# Patient Record
Sex: Male | Born: 1950 | Race: White | Hispanic: No | Marital: Married | Smoking: Current some day smoker
Health system: Southern US, Community
[De-identification: ages and names within clinical notes are randomized; demographics above are authoritative.]

## PROBLEM LIST (undated history)

## (undated) DIAGNOSIS — E785 Hyperlipidemia, unspecified: Secondary | ICD-10-CM

## (undated) DIAGNOSIS — N200 Calculus of kidney: Secondary | ICD-10-CM

## (undated) DIAGNOSIS — I2510 Atherosclerotic heart disease of native coronary artery without angina pectoris: Secondary | ICD-10-CM

## (undated) DIAGNOSIS — I739 Peripheral vascular disease, unspecified: Secondary | ICD-10-CM

## (undated) DIAGNOSIS — Z9289 Personal history of other medical treatment: Secondary | ICD-10-CM

## (undated) DIAGNOSIS — K219 Gastro-esophageal reflux disease without esophagitis: Secondary | ICD-10-CM

## (undated) HISTORY — DX: Personal history of other medical treatment: Z92.89

## (undated) HISTORY — DX: Hyperlipidemia, unspecified: E78.5

## (undated) HISTORY — PX: CORONARY STENT PLACEMENT: SHX1402

## (undated) HISTORY — DX: Calculus of kidney: N20.0

## (undated) HISTORY — PX: OTHER SURGICAL HISTORY: SHX169

## (undated) HISTORY — DX: Atherosclerotic heart disease of native coronary artery without angina pectoris: I25.10

## (undated) HISTORY — DX: Peripheral vascular disease, unspecified: I73.9

## (undated) HISTORY — DX: Gastro-esophageal reflux disease without esophagitis: K21.9

---

## 1999-06-19 ENCOUNTER — Inpatient Hospital Stay (HOSPITAL_COMMUNITY): Admission: RE | Admit: 1999-06-19 | Discharge: 1999-06-20 | Payer: Self-pay | Admitting: Orthopaedic Surgery

## 1999-06-19 ENCOUNTER — Encounter (INDEPENDENT_AMBULATORY_CARE_PROVIDER_SITE_OTHER): Payer: Self-pay | Admitting: *Deleted

## 2003-05-10 ENCOUNTER — Encounter: Admission: RE | Admit: 2003-05-10 | Discharge: 2003-05-10 | Payer: Self-pay | Admitting: Orthopaedic Surgery

## 2003-06-03 ENCOUNTER — Encounter: Admission: RE | Admit: 2003-06-03 | Discharge: 2003-06-03 | Payer: Self-pay | Admitting: Orthopaedic Surgery

## 2005-09-01 ENCOUNTER — Emergency Department (HOSPITAL_COMMUNITY): Admission: EM | Admit: 2005-09-01 | Discharge: 2005-09-01 | Payer: Self-pay | Admitting: Family Medicine

## 2005-09-04 ENCOUNTER — Emergency Department (HOSPITAL_COMMUNITY): Admission: EM | Admit: 2005-09-04 | Discharge: 2005-09-04 | Payer: Self-pay | Admitting: Family Medicine

## 2005-09-11 ENCOUNTER — Emergency Department (HOSPITAL_COMMUNITY): Admission: EM | Admit: 2005-09-11 | Discharge: 2005-09-11 | Payer: Self-pay | Admitting: Family Medicine

## 2007-10-24 ENCOUNTER — Ambulatory Visit: Payer: Self-pay | Admitting: Cardiology

## 2007-10-25 ENCOUNTER — Inpatient Hospital Stay (HOSPITAL_COMMUNITY): Admission: EM | Admit: 2007-10-25 | Discharge: 2007-10-27 | Payer: Self-pay | Admitting: Emergency Medicine

## 2007-11-16 ENCOUNTER — Ambulatory Visit: Payer: Self-pay | Admitting: Cardiology

## 2007-12-20 ENCOUNTER — Ambulatory Visit: Payer: Self-pay

## 2007-12-20 ENCOUNTER — Ambulatory Visit: Payer: Self-pay | Admitting: Cardiology

## 2007-12-20 LAB — CONVERTED CEMR LAB
ALT: 27 units/L (ref 0–53)
AST: 27 units/L (ref 0–37)
Alkaline Phosphatase: 76 units/L (ref 39–117)
Total Bilirubin: 1 mg/dL (ref 0.3–1.2)
Total CHOL/HDL Ratio: 3.8
VLDL: 18 mg/dL (ref 0–40)

## 2008-03-30 DIAGNOSIS — E785 Hyperlipidemia, unspecified: Secondary | ICD-10-CM

## 2008-04-01 DIAGNOSIS — I2510 Atherosclerotic heart disease of native coronary artery without angina pectoris: Secondary | ICD-10-CM

## 2008-04-02 ENCOUNTER — Encounter: Payer: Self-pay | Admitting: Cardiology

## 2008-04-02 ENCOUNTER — Ambulatory Visit: Payer: Self-pay | Admitting: Cardiology

## 2008-04-21 ENCOUNTER — Emergency Department (HOSPITAL_COMMUNITY): Admission: EM | Admit: 2008-04-21 | Discharge: 2008-04-21 | Payer: Self-pay | Admitting: *Deleted

## 2008-06-05 ENCOUNTER — Telehealth: Payer: Self-pay | Admitting: Cardiology

## 2008-06-27 ENCOUNTER — Telehealth: Payer: Self-pay | Admitting: Cardiology

## 2008-07-01 ENCOUNTER — Encounter: Payer: Self-pay | Admitting: Cardiology

## 2008-08-06 ENCOUNTER — Emergency Department (HOSPITAL_COMMUNITY): Admission: EM | Admit: 2008-08-06 | Discharge: 2008-08-06 | Payer: Self-pay | Admitting: Family Medicine

## 2008-10-01 ENCOUNTER — Ambulatory Visit: Payer: Self-pay | Admitting: Cardiology

## 2008-11-14 ENCOUNTER — Telehealth: Payer: Self-pay | Admitting: Cardiology

## 2009-04-26 ENCOUNTER — Emergency Department (HOSPITAL_COMMUNITY): Admission: EM | Admit: 2009-04-26 | Discharge: 2009-04-26 | Payer: Self-pay | Admitting: Family Medicine

## 2009-09-15 ENCOUNTER — Ambulatory Visit: Payer: Self-pay | Admitting: Cardiology

## 2009-09-15 DIAGNOSIS — I739 Peripheral vascular disease, unspecified: Secondary | ICD-10-CM

## 2009-10-08 ENCOUNTER — Telehealth (INDEPENDENT_AMBULATORY_CARE_PROVIDER_SITE_OTHER): Payer: Self-pay | Admitting: *Deleted

## 2009-10-08 ENCOUNTER — Ambulatory Visit: Payer: Self-pay | Admitting: Cardiovascular Disease

## 2009-10-08 ENCOUNTER — Ambulatory Visit: Payer: Self-pay

## 2009-10-14 ENCOUNTER — Telehealth (INDEPENDENT_AMBULATORY_CARE_PROVIDER_SITE_OTHER): Payer: Self-pay | Admitting: *Deleted

## 2010-02-01 LAB — CONVERTED CEMR LAB
ALT: 26 units/L (ref 0–53)
AST: 23 units/L (ref 0–37)
AST: 24 units/L (ref 0–37)
Albumin: 4.7 g/dL (ref 3.5–5.2)
Alkaline Phosphatase: 70 units/L (ref 39–117)
Alkaline Phosphatase: 78 units/L (ref 39–117)
BUN: 16 mg/dL (ref 6–23)
Basophils Absolute: 0 10*3/uL (ref 0.0–0.1)
Basophils Relative: 0.5 % (ref 0.0–3.0)
Bilirubin, Direct: 0 mg/dL (ref 0.0–0.3)
CO2: 32 meq/L (ref 19–32)
CO2: 32 meq/L (ref 19–32)
Calcium: 10 mg/dL (ref 8.4–10.5)
Chloride: 107 meq/L (ref 96–112)
Cholesterol: 146 mg/dL (ref 0–200)
Cholesterol: 168 mg/dL (ref 0–200)
Creatinine, Ser: 0.9 mg/dL (ref 0.4–1.5)
Eosinophils Absolute: 0.1 10*3/uL (ref 0.0–0.7)
Eosinophils Relative: 1.4 % (ref 0.0–5.0)
GFR calc non Af Amer: 82.7 mL/min (ref >60)
GFR calc non Af Amer: 92.65 mL/min (ref >60)
Glucose, Bld: 110 mg/dL — ABNORMAL HIGH (ref 70–99)
HCT: 47.9 % (ref 39.0–52.0)
HCT: 48.4 % (ref 39.0–52.0)
HDL: 34.5 mg/dL — ABNORMAL LOW (ref >39.00)
HDL: 37.4 mg/dL — ABNORMAL LOW (ref >39.00)
Hemoglobin: 16.4 g/dL (ref 13.0–17.0)
LDL Cholesterol: 75 mg/dL (ref 0–99)
Lymphocytes Relative: 27.2 % (ref 12.0–46.0)
Lymphocytes Relative: 27.3 % (ref 12.0–46.0)
Lymphs Abs: 1.9 10*3/uL (ref 0.7–4.0)
MCHC: 33.8 g/dL (ref 30.0–36.0)
MCHC: 34.1 g/dL (ref 30.0–36.0)
MCV: 92.5 fL (ref 78.0–100.0)
MCV: 92.8 fL (ref 78.0–100.0)
Monocytes Absolute: 0.6 10*3/uL (ref 0.1–1.0)
Monocytes Relative: 5.8 % (ref 3.0–12.0)
Monocytes Relative: 9.1 % (ref 3.0–12.0)
Neutro Abs: 4.4 10*3/uL (ref 1.4–7.7)
Neutrophils Relative %: 61.7 % (ref 43.0–77.0)
Platelets: 175 10*3/uL (ref 150.0–400.0)
Potassium: 4.5 meq/L (ref 3.5–5.1)
Potassium: 5 meq/L (ref 3.5–5.1)
RBC: 5.24 M/uL (ref 4.22–5.81)
RDW: 12.7 % (ref 11.5–14.6)
RDW: 13.4 % (ref 11.5–14.6)
Sodium: 143 meq/L (ref 135–145)
Sodium: 144 meq/L (ref 135–145)
Total Bilirubin: 1 mg/dL (ref 0.3–1.2)
Total CHOL/HDL Ratio: 4
Total Protein: 7.6 g/dL (ref 6.0–8.3)
Total Protein: 7.7 g/dL (ref 6.0–8.3)
Triglycerides: 181 mg/dL — ABNORMAL HIGH (ref 0.0–149.0)
Triglycerides: 185 mg/dL — ABNORMAL HIGH (ref 0.0–149.0)
VLDL: 36.2 mg/dL (ref 0.0–40.0)
WBC: 7 10*3/uL (ref 4.5–10.5)

## 2010-02-03 NOTE — Assessment & Plan Note (Signed)
Summary: f1y/appt time 1:30/lg   Visit Type:  Follow-up Primary Provider:  none  CC:  no complaints.  History of Present Illness: The patient is 60 years old and return for management of CAD. 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 His ejection fraction was 6% that time. 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  He has done well since his heart attack. He had no recent chest pain shortness of breath or palpitations.  His other major problem is hyperlipidemia.  He is still unemployed and has severe back problems that limit some of his options for working. His wife works and 70 now has some financial help and he does have insurance and his wife.  Current Medications (verified): 1)  Aspirin 81 Mg Tbec (Aspirin) .... Take One Tablet By Mouth Daily 2)  Crestor 40 Mg Tabs (Rosuvastatin Calcium) .... Take One Tablet By Mouth Daily. 3)  Nitroglycerin 0.4 Mg Subl (Nitroglycerin) .... One Tablet Under Tongue Every 5 Minutes As Needed For Chest Pain---May Repeat Times Three  Allergies (verified): No Known Drug Allergies  Past History:  Past Medical History: Reviewed history from 03/30/2008 and no changes required.  1. Coronary artery disease status post inferior wall myocardial     infarction on October 24, 2007, treated with a bare-metal stent to     the right coronary artery with residual 70% narrowing in the left     anterior descending. 2. Good left ventricular function with ejection fraction of 60%. 3. Hyperlipidemia.   Review of Systems       ROS is negative except as outlined in HPI.   Vital Signs:  Patient profile:   60 year old male Height:      69 inches Weight:      200 pounds BMI:     29.64 Pulse rate:   69 / minute BP sitting:   153 / 92  (left arm) Cuff size:   regular  Vitals Entered By: Burnett Kanaris, CNA (September 15, 2009 1:32 PM)  Physical Exam  Additional Exam:  Gen.  Well-nourished, in no distress   Neck: No JVD, thyroid not enlarged, no carotid bruits Lungs: No tachypnea, clear without rales, rhonchi or wheezes Cardiovascular: Rhythm regular, PMI not displaced,  heart sounds  normal, no murmurs or gallops, no peripheral edema, pulses normal in all 4 extremities. Abdomen: BS normal, abdomen soft and non-tender without masses or organomegaly, no hepatosplenomegaly. MS: No deformities, no cyanosis or clubbing   Neuro:  No focal sns   Skin:  no lesions    Impression & Recommendations:  Problem # 1:  CAD, NATIVE VESSEL (ICD-414.01)  He had a previous anterior MI with a bare-metal stent to the RCA. He's had no recent chest pain in the thumb appears stable. His updated medication list for this problem includes:    Aspirin 81 Mg Tbec (Aspirin) .Marland Kitchen... Take one tablet by mouth daily    Nitroglycerin 0.4 Mg Subl (Nitroglycerin) ..... One tablet under tongue every 5 minutes as needed for chest pain---may repeat times three  Orders: EKG w/ Interpretation (93000)  His updated medication list for this problem includes:    Aspirin 81 Mg Tbec (Aspirin) .Marland Kitchen... Take one tablet by mouth daily    Nitroglycerin 0.4 Mg Subl (Nitroglycerin) ..... One tablet under tongue every 5 minutes as needed for chest pain---may repeat times three  Problem # 2:  HYPERLIPIDEMIA-MIXED (ICD-272.4)  We will plan to get a fasting lipid profile. His updated medication list for this problem includes:    Crestor 40 Mg Tabs (Rosuvastatin calcium) .Marland Kitchen... Take one tablet by mouth daily.  His updated medication list for this problem includes:    Crestor 40 Mg Tabs (Rosuvastatin calcium) .Marland Kitchen... Take one tablet by mouth daily.  Problem # 3:  CLAUDICATION (ICD-443.9) He has right calf pain with walking which is suspicious for claudication. His pulses are pretty good in both feet. He also had lumbosacral spine disease and he had some symptoms higher in his leg and is possible his symptoms could be  related to that. We will evaluate him with arterial Doppler studies.  Other Orders: Arterial Duplex Lower Extremity (Arterial Duplex Low)  Patient Instructions: 1)  Your physician recommends that you schedule a follow-up appointment in: 1 YEAR WITH DR [facility] 2)  Your physician recommends that you return for lab work EA:[id number] BMET CBC LIPID LIVER TSH  SAME DAY AS ARTERIAL ULTRASOUND 3)  Your physician recommends that you continue on your current medications as directed. Please refer to the Current Medication list given to you today. 4)  Your physician has requested that you have a lower or upper extremity arterial duplex.  This test is an ultrasound of the arteries in the legs or arms.  It looks at arterial blood flow in the legs and arms.  Allow one hour for Lower and Upper Arterial scans. There are no restrictions or special instructions.LABS SAME DAY

## 2010-02-03 NOTE — Progress Notes (Signed)
  Phone Note Other Incoming   Request: Send information Summary of Call: Request for records received from [facility]. Request forwarded to [facility] )

## 2010-02-03 NOTE — Progress Notes (Signed)
  Request for records recieved from [facility] sent to [facility]  October 14, 2009 8:51 AM

## 2010-03-24 LAB — POCT URINALYSIS DIP (DEVICE)
Ketones, ur: NEGATIVE mg/dL
Nitrite: NEGATIVE
Specific Gravity, Urine: >=1.03 (ref 1.005–1.030)
Urobilinogen, UA: 0.2 mg/dL (ref 0.0–1.0)
pH: 5 (ref 5.0–8.0)

## 2010-04-11 LAB — POCT URINALYSIS DIP (DEVICE)
Glucose, UA: NEGATIVE mg/dL
Specific Gravity, Urine: >=1.03 (ref 1.005–1.030)
pH: 5 (ref 5.0–8.0)

## 2010-04-15 LAB — URINALYSIS, ROUTINE W REFLEX MICROSCOPIC
Bilirubin Urine: NEGATIVE
Glucose, UA: NEGATIVE mg/dL
Hgb urine dipstick: NEGATIVE
Protein, ur: 30 mg/dL — AB
Urobilinogen, UA: 0.2 mg/dL (ref 0.0–1.0)
pH: 5.5 (ref 5.0–8.0)

## 2010-04-15 LAB — URINE MICROSCOPIC-ADD ON

## 2010-04-15 LAB — POCT I-STAT, CHEM 8
Calcium, Ion: 1.14 mmol/L (ref 1.12–1.32)
Hemoglobin: 16.7 g/dL (ref 13.0–17.0)
TCO2: 23 mmol/L (ref 0–100)

## 2010-05-19 NOTE — Procedures (Signed)
[facility]                              EXERCISE TREADMILL   NAME:PENNINGERThierry, Dobosz                      MRN:          [id number]  DATE:12/20/2007                            DOB:          12/24/1950    PRIMARY CARE PHYSICIAN:  None.   INDICATIONS:  Evaluation for ischemia.   The patient is able to exercise 8 minutes on a Bruce protocol and  achieved the heart rate of 125, at which time the test was terminated  due to light fatigue.  There was no chest pain.  The resting  electrocardiogram was normal.  There were no arrhythmias during  exercise.  Also __________EKG showed no significant ST segment changes.  Blood pressure went from 118/78 to 167/83.  This interpreted the  nondiagnostic test and inadequate heart rate, but there was evidence of  ischemia.   Gwynn Gallardo returned for followup treadmill test today.  He had a  diaphragmatic wall infarction in October 24, 2007, treated with bare-  metal stent with right coronary, had residual 70% narrowing in the LAD.  Ejection fraction was 60%.  We did this treadmill test on today to  assess him for ischemia.  We did a stress ECG rather than a Myoview scan  for financial reasons.   His stress test was quite good, although he did not achieve the target  heart rate.  There was no evidence of ischemia.  Based on these  findings, I think we can continue medical therapy.  He is getting a  lipid profile today.  We will plan to see him to back in followup in 3  months.  He is getting help with both his Plavix and his Crestor from  the company assistant programs.     Bruce Elvera Lennox Juanda Chance, MD, [facility]  Electronically Signed    BRB/MedQ  DD: 12/20/2007  DT: 12/21/2007  Job #: [id number]

## 2010-05-19 NOTE — H&P (Signed)
NAME:  Keith, [name] NO.:  [id number]   MEDICAL RECORD NO.:  [id number]          PATIENT TYPE:  EMS   LOCATION:  MAJO                         FACILITY:  [facility]   PHYSICIAN:  Darryl D. Prime, MD    DATE OF BIRTH:  01/10/1950   DATE OF ADMISSION:  10/24/2007  DATE OF DISCHARGE:                              HISTORY & PHYSICAL   CHIEF COMPLAINT:  Chest pain.   PRIMARY CARE PHYSICIAN:  Keith [name] has no primary care physician.   CODE STATUS:  Keith [name] is full code.   TOTAL VISIT TIME:  Approximately 30 minutes.  Code STEMI was called in  the emergency room.   HISTORY OF PRESENT ILLNESS:  Keith [name] is a 60 year old male with a  history of longstanding tobacco abuse and strong family history of  coronary artery disease and events who presents with chest pain.  The  patient notes mild chest pressure on yesterday around 5:00 p.m. lasting  less than 30 minutes that went away spontaneously.  The patient then  notes chest pain around 9:30 p.m. tonight around 7/10 with associated  sweats but no associated nausea, vomiting.  The patient was brought to  the emergency room by his wife.  The patient's first EKG at 2223 showed  sinus bradycardia at 50 beats per minute.  Keith [name] had less than 1 mm ST  elevation inferiorly.  The patient, at 2235, was in sinus rhythm at 69  beats per minute, PR interval 163, QRS 83, QT corrected at 410, and Keith [name]  had normal axis, had 3 mm ST elevation inferiorly, and had ST  depressions leads V1-V3 and ST elevation V5-V6 concerning for inferior  posterior injury.  The patient was given heparin bolus 1000 units  sublingual nitroglycerin, morphine 4 mg total, Lopressor 5 IV, chest  pain still being 6-7/10 range.  Keith [name] was emergently taken to the cath lab.   PAST MEDICAL AND SURGICAL HISTORY:  Keith [name] has not seen a primary care  physician in many years.  Keith [name] has a history of two back surgeries, and Keith [name]  has a history of childhood hepatitis.   ALLERGIES:  NO KNOWN DRUG  ALLERGIES.   MEDICATIONS:  None.   SOCIAL HISTORY:  The patient apparently has smoked since the age of 29,  two packs a day.  No alcohol.  Keith [name] was recently laid off.  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   FAMILY HISTORY:  Father had MI in his 30s and passed away from that.  Keith [name]  has three sisters with coronary artery disease.  This began in the 62s.  One sister is status post CABG.   REVIEW OF SYSTEMS:  Limited.  The patient denies any fever.  Keith [name] notes  mild shortness of breath.  Denies any recent black stools or bloody  stools.   PHYSICAL EXAMINATION:  VITAL SIGNS:  Pulse 72 with a respiratory rate of  14, blood pressure 134/70 with sats at 100% on 2 liters nasal cannula.  GENERAL:  Keith [name] is a male, sitting upright in bed in mild diaphoresis.  HEENT:  Normocephalic, atraumatic.  Pupils equal, round, reactive to  light.  Extraocular movements grossly intact.  Conjunctivae is not pale.  The patient's oropharynx reveals no posterior pharyngeal lesions.  NECK:  Supple with no lymphadenopathy or thyromegaly.  No jugular venous  distention was appreciated.  No carotid bruits.  LUNGS:  Clear to auscultation bilaterally.  CARDIOVASCULAR:  Regular rhythm and rate with no murmurs, rubs or  gallops.  Normal S1, S2.  No S3-S4.  ABDOMEN:  Soft, nontender, nondistended with no hepatosplenomegaly.  EXTREMITIES;  show no clubbing, cyanosis or edema.  NEUROLOGIC:  Alert and oriented x4.  Cranial nerves II-XII grossly  intact.  Strength and sensation are grossly intact.  The EKGs are as  above.  VASCULAR:  Femoral pulses 2+.  Dorsalis pedis, posterior tibial and  radial pulses 2+ symmetric.   LABORATORY DATA:  Pending.  EKG is as above.  Keith [name] had an EKG June 17, 1999, that was normal sinus rhythm at 74 beats per minute.  There were  no significant EKG ST-segment changes at that time.   ASSESSMENT:  A patient with a history of his longstanding tobacco abuse  and family history significant for coronary artery  disease who presents  now with inferior posterior MI.  Keith [name] will be admitted emergently and  taken to the cath lab with further course of his management depending on  the results of catheterization nondiagnostic.      Darryl D. Prime, MD  Electronically Signed     DDP/MEDQ  D:  10/24/2007  T:  10/25/2007  Job:  [id number]

## 2010-05-19 NOTE — Cardiovascular Report (Signed)
NAME:  Keith [name], Keith [name] NO.:  [id number]   MEDICAL RECORD NO.:  [id number]          PATIENT TYPE:  INP   LOCATION:  2906                         FACILITY:  [facility]   PHYSICIAN:  Everardo Beals. Juanda Chance, MD, FACCDATE OF BIRTH:  23-Aug-1950   DATE OF PROCEDURE:  10/24/2007  DATE OF DISCHARGE:                            CARDIAC CATHETERIZATION   Keith [name] is 60 years old and has no prior history of known heart  disease.  He worked for a [employer], but recently got laid off.  He developed chest pain tonight and came to the emergency room where his  ECG showed acute inferior myocardial infarction.  He was seen in consult  by Dr. Oralia Rud, our Cardiology fellow and he was transferred promptly to  the catheterization laboratory for intervention.  He is a smoker.   He has been on no medications.  He has no primary care physician.   PROCEDURE:  The procedure was performed via the right femoral arteries  and arterial sheath and a 6-French preformed coronary catheters.  We did  a diagnostic cath from the left coronary artery and went in with a guide  to the right coronary artery.  We had to change to an AL1 catheter  because the right coronary artery had a high anterior takeoff.  The  patient developed hypotension before we crossed with the wire requiring  dopamine.  We also placed a venous sheath in the right femoral vein.  We  then navigated a Prowater wire across the lesion and dilated with a 2.25  x 20-mm apex balloon performing 2 inflations up to 10 atmospheres for 30  seconds.  We then deployed a 2.75 x 28-mm Liberte deploying this with 1  inflation up to 15 atmospheres for 30 seconds.  We postdilated with a  3.25 x 20-mm [place] Voyager performing 3 inflations up to 16 atmospheres for  30 seconds.  Final diagnostic studies were then performed through the  guiding catheter.  The patient required dopamine intermittently  throughout the procedure.  He had some runs of accelerated  ventricular  rhythm, otherwise he tolerated the procedure well and left the  laboratory in satisfactory condition.   RESULTS:  The aortic pressure was 98/59 with mean of 73 and left  ventricular pressure was 98/20.   The left main coronary artery.  Main coronary artery was free of  significant disease.   Left anterior descending artery.  Anterior descending artery gave rise  to 3 septal perforators in diagonal branch.  There was a long 70%  segmental stenosis extending from the third septal perforator and  crossing the first diagonal branch, located in the proximal LAD.   The circumflex artery.  Circumflex artery was a moderate-sized vessel  that gave rise to 2 atrial branches in the marginal branch and 2 large  posterolateral branches.  These vessels were irregular with no  significant obstruction.   The right coronary artery.  The right coronary artery was a moderate-  sized vessel that gave rise to a right ventricular branch, then had a  99% stenosis with TIMI 1 flow.  There was  very faint collaterals from  the left coronary system to the distal right coronary artery.   The left ventriculogram.  Underlying left ventriculogram performed in  RAO projection shows a slight hypokinesis of the inferobasal wall.  The  overall motion was quite good with an estimated ejection fraction of  60%.   Following stenting of the lesion in the mid-right coronary artery, the  stenosis improved from 99% to 0% and the flow improved from TIMI 1 to  TIMI 3 flow.  There was also a 50% proximal stenosis which was not  treated.  Distal vessel filled dual posterior descending branches and a  moderate-sized posterolateral branch which were free of significant  disease.   The patient had the onset of chest pain at 2130 and arrived in the  emergency room at 2229.  He arrived in the cath lab at 2315 and the  first balloon inflation was at 2346.  He had a total balloon time of 77  minutes and reinfusion  time of 2 hours 16 minutes.   CONCLUSION:  1. Acute diaphragmatic wall infarction with 99% stenosis in the mid      right coronary artery with TIMI 1 flow, no major obstruction of      circumflex artery, 70% narrowing in the proximal LAD, and mild      inferobasal wall hypokinesis with an estimated ejection fraction of      60%.  2. Successful reperfusion and stenting of the lesion in the mid-right      coronary artery using a Liberte bare metal stent with improvement      in central narrowing from 99% to 0% and improvement in the flow      from TIMI 1 to TIMI 3 flow.   DISPOSITION:  The patient was returned to __________ for further  observation.  We will plan to evaluate the LAD lesion later with a  Myoview scan after he is recovered from his infarction.  He will need  smoking cessation consultation.      Bruce Elvera Lennox Juanda Chance, MD, [facility]  Electronically Signed     BRB/MEDQ  D:  10/25/2007  T:  10/25/2007  Job:  [id number]

## 2010-05-19 NOTE — Assessment & Plan Note (Signed)
[facility]                            CARDIOLOGY OFFICE NOTE   NAME:PENNINGERKeola, Heninger                      MRN:          [id number]  DATE:11/16/2007                            DOB:          Dec 27, 1950    ADDENDUM   Mr. Scogin also described calf pain when he walks, it sounds like  claudication.  We will evaluate that when he returns for his treadmill  test.     Bruce R. Juanda Chance, MD, [facility]     BRB/MedQ  DD: 11/16/2007  DT: 11/17/2007  Job #: [id number]

## 2010-05-19 NOTE — Assessment & Plan Note (Signed)
[facility]                            CARDIOLOGY OFFICE NOTE   [name], Keith [name]                      MRN:          [id number]  DATE:11/16/2007                            DOB:          August 06, 1950    PRIMARY CARE PHYSICIAN:  None.   CLINICAL HISTORY:  [name] is 60 years old and was admitted on  October 24, 2007, with a diaphragmatic wall infarction and underwent  stenting of the right coronary artery with a bare-metal stent.  He had  residual 70% narrowing in the LAD and he had an ejection fraction of  60%.   He has done quite well since that time, has had no recurrent chest pain,  shortness of breath, or palpitations.   Past medical history is significant for hyperlipidemia and previous back  surgeries.   His current medications include Crestor 40 mg daily, Plavix 75 mg daily,  and aspirin 325 mg daily.   On examination today, the blood pressure is 104/80.  There was no venous  tension.  The carotid pulses were full without bruits.  Chest was clear.  Cardiac rhythm was regular.  I could hear no murmurs or gallops.  The  abdomen was soft with normal bowel sounds.  There is no  hepatosplenomegaly.  Peripheral pulses were full with no peripheral  edema.   Electrocardiogram showed inferior T-wave changes.   IMPRESSION:  1. Coronary artery disease status post inferior wall myocardial      infarction on October 24, 2007, treated with a bare-metal stent to      the right coronary artery with residual 70% narrowing in the left      anterior descending.  2. Good left ventricular function with ejection fraction of 60%.  3. Hyperlipidemia.   RECOMMENDATIONS:  I think [name] is doing very well.  I told him  he could gradually increase his activities over the next 2 weeks toward  normal.  He cannot do the rehab program for financial reasons and I  talked to him about walking on a regular schedule.  He is already  walking 20 minutes  twice a day.  We will have him come in for a fasting  lipid profile in 4 weeks after he has been on the Crestor for 4 weeks.  We will do a standard stress test rather than a Myoview scan for  financial reasons to screen him for ischemia in left anterior descending  distribution.  We will do this in 4 weeks.  We are still awaiting based  on Myoview regarding help with Plavix.   ADDENDUM:  [name] also described calf pain when he walks, it  sounds like claudication.  We will evaluate that when he returns for his  treadmill test.     Bruce R. Juanda Chance, MD, [facility]  Electronically Signed    BRB/MedQ  DD: 11/16/2007  DT: 11/17/2007  Job #: [id number]

## 2010-05-19 NOTE — Discharge Summary (Signed)
NAME:  Keith [name], SCRIBNER NO.:  [id number]   MEDICAL RECORD NO.:  [id number]          PATIENT TYPE:  INP   LOCATION:  2926                         FACILITY:  [facility]   PHYSICIAN:  Everardo Beals. Juanda Chance, MD, FACCDATE OF BIRTH:  1950-02-10   DATE OF ADMISSION:  10/24/2007  DATE OF DISCHARGE:  10/27/2007                               DISCHARGE SUMMARY   PROCEDURES:  1. Cardiac catheterization.  2. Coronary arteriogram.  3. Left ventriculogram.  4. Percutaneous transluminal coronary angioplasty bare-metal stent to      one vessel.   PRIMARY FINAL DISCHARGE DIAGNOSIS:  Acute inferior ST elevation  myocardial infarction.   SECONDARY DIAGNOSES:  1. Preserved left ventricular function with an ejection fraction of      60% at catheterization, inferior wall motion abnormality noted.  2. Residual coronary artery disease of the left anterior descending at      70% and the proximal right coronary artery 50% for which medical      therapy is recommended.  3. Tobacco use.  4. Sinus bradycardia occasionally dropping into the 40s, asymptomatic.  5. Possible hyperlipidemia, lipid profile pending at the time of      dictation.  6. History of back surgery.  7. Family history of coronary artery disease in his mother and father.   TIME OF DISCHARGE:  43 minutes.   HOSPITAL COURSE:  Keith [name] is a 60 year old male with no previous  history of coronary artery disease.  He had chest pain that started the  night of admission and was brought to the emergency room by his wife.  His EKG indicated an inferior ST elevation MI and he was taken urgently  to the cath lab.  Total balloon time was 77 minutes.   Keith [name] had a 99% RCA in the mid section that was treated with  PTCA and a bare-metal stent reducing the stenosis to 0.  His flow went  from TIMI 1 to TIMI 3.  Residual coronary artery disease is to be  treated medically.  His EF was approximately 60% with inferior   hypokinesis.   Keith [name] was seen by cardiac rehab and smoking cessation.  He  states he has been told that he has elevated cholesterol and lipid  profile is pending at the time of dictation.  A statin was added  empirically to his medication regimen and he will follow up in our  office.  His heart rate was noted to be in the 50s and occasional into  the 40s.  For this reason, no beta-blocker was started.  He is  asymptomatic with this and no further workup is indicated at this time.   On October 27, 2007, Keith [name] was ambulating without chest pain or  shortness of breath.  Care management had seen him for medication  assistance as both he and his wife are currently out of work.  Plavix  assistance forms and Crestor assistance forms were filled out and we  will follow up in the office.  Dr. Juanda Chance evaluated Keith [name] on  October 27, 2007 and considered him stable for discharge with  close  outpatient followup.   DISCHARGE INSTRUCTIONS:  1. His activity level is to be increased gradually with no lifting for      3 weeks and no driving for 4 days.  2. He is to call our office for any problems with the cath site.  3. He is to stick to a low-sodium heart-healthy diet.  4. He is encouraged to obtain a primary care physician.  5. He has an appoint with Dr. Juanda Chance on November 16, 2007 at 3:45.   DISCHARGE MEDICATIONS:  1. Aspirin 325 mg daily.  2. Plavix 75 mg daily.  3. Nitroglycerin sublingual p.r.n.  4. Simvastatin 80 mg or Crestor 40 mg daily p.m.   He is strongly encouraged not to use tobacco.      Theodore Demark, PA-C      Bruce R. Juanda Chance, MD, [facility]  Electronically Signed    RB/MEDQ  D:  10/27/2007  T:  10/27/2007  Job:  [id number]
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
## 2010-05-22 NOTE — Op Note (Signed)
[place]  Patient:    Keith [name], Keith [name]                      MRN: [id number] Proc. Date: 06/19/99 Adm. Date:  [id number] Disc. Date: [id number] Attending:  Jacki Cones                           Operative Report  PREOPERATIVE DIAGNOSIS:  Recurrent L3-4 herniated nucleus pulposus right, L5-S1 herniated nucleus pulposus left with free fragment.  POSTOPERATIVE DIAGNOSIS:  Recurrent L3-4 herniated nucleus pulposus right, L5-S1 herniated nucleus pulposus left with free fragment.  OPERATION PERFORMED:  Re-exploration and microdiskectomy right L3-4 with removal of herniated nucleus pulposus, left L5-S1 microdiskectomy and removal of free fragment.  SURGEON:  Mark C. Ophelia Charter, M.D.  ASSISTANT: 1. ____________ 2. Quinn Plowman, [place]  ANESTHESIA:  General orotracheal.  ESTIMATED BLOOD LOSS:  225 cc.  DESCRIPTION OF PROCEDURE:  After induction of general anesthesia, patient placed in kneeling position on the Blyn frame with careful padding.  The patients back was prepped with DuraPrep.  Preoperative Ancef was given prophylactically.  Old incision from thet 3-4 exploration was visualized and cross table lateral x-ray was taken with one needle slightly inferior to 3-4 at the level of the disk herniation on MRI and the other needle at L5-S1. Cross table lateral confirmed that these needles were appropriately placed. Incision was made in the midline using the old incision, extending it proximally and distally.  L5-S1 herniated nucleus pulposus on the left was explored first.  Subperiosteal dissection was performed over the lamina. Taylor retractor placed lateral.  Key hole laminotomy made with a Kerrison. Incision of the ligament.  Dura was gently retracted to the midline and free fragment disk was present that was in the left lateral gutter above the level of the disk space.  Once the fragment was teased out, exploration revealed some further small  pieces which were removed.  Hockey stick was used for palpation and there were no remaining fragments.  The nerve root was freed. Several passes were made through the disk space from the exit hole from the rupture.  A moderate amount of degenerative material was removed.  The foramen was enlarged, nerve root was free.  After irrigation with saline solution, attention was turned to the second level.  Surgeons and assistants switched sides and the right 3-4 level was explored.  At this level there was previous laminotomy defect which was taken cephalad to an area of normal tissue.  There was dense fibrotic scar tissue with the dura adherent to the lateral gutter and lateral disk material which was fibrosed, encapsulated and stuck to the gutter.  Dissection was performed until the disk was finally identified using Epstein curet.  The annulus was incised with a 15 scalpel blade.  Passes were made to the disk removing a small amount of degenerative material.  There were disk fragments laterally in the gutter and also inferior to the level of the disk consistent with the MRI scan.  Disk and scar tissue were adherent at the nerve root shoulder just inferior to the disk space just prior to the nerve swinging out underneath the pedicle and a small dural tear occurred on the volar aspect.  It was 1 mm in size.  There was some spinal fluid leakage and a small piece of Surgicel 3 x 3 mm was placed over it.  The patient was valsalvad up  to 35 cmH2O x 5 with no leakage.  Disk material was removed anterior to the dura until the L4 vertebra body was exposed and all old disk material had been removed.  The foramen was palpated and was free.  After irrigation with saline solution, repeat Valsalva was checked with no spinal fluid leakage.  There was one epidural vein proximally that had been covered with a patty, had a little bit of bleeding and was left alone and had a small amount of bleeding.  The fascia  was closed with 0 Vicryl and 2-0 Vicryl in the subcutaneous tissues, skin staple closure, Marcaine infiltration of the skin. Postoperative dressing.  The patient tolerated the procedure well and was transferred to the recovery room in stable condition. DD:  06/19/99 TD:  06/23/99 Job: [id number]

## 2010-06-27 ENCOUNTER — Inpatient Hospital Stay (INDEPENDENT_AMBULATORY_CARE_PROVIDER_SITE_OTHER)
Admission: RE | Admit: 2010-06-27 | Discharge: 2010-06-27 | Disposition: A | Discharge status: Home / Self Care | Payer: PRIVATE HEALTH INSURANCE | Source: Ambulatory Visit | Attending: Family Medicine | Admitting: Family Medicine

## 2010-06-27 ENCOUNTER — Ambulatory Visit (INDEPENDENT_AMBULATORY_CARE_PROVIDER_SITE_OTHER): Payer: PRIVATE HEALTH INSURANCE

## 2010-06-27 DIAGNOSIS — N200 Calculus of kidney: Secondary | ICD-10-CM

## 2010-06-27 LAB — POCT URINALYSIS DIP (DEVICE)
Glucose, UA: NEGATIVE mg/dL
Ketones, ur: NEGATIVE mg/dL
Leukocytes, UA: NEGATIVE
Nitrite: NEGATIVE
Specific Gravity, Urine: 1.015 (ref 1.005–1.030)
pH: 5.5 (ref 5.0–8.0)

## 2010-09-14 ENCOUNTER — Ambulatory Visit: Payer: PRIVATE HEALTH INSURANCE | Admitting: Cardiovascular Disease

## 2010-09-25 ENCOUNTER — Encounter: Payer: Self-pay | Admitting: *Deleted

## 2010-09-25 ENCOUNTER — Encounter: Payer: Self-pay | Admitting: Cardiovascular Disease

## 2010-09-28 ENCOUNTER — Encounter: Payer: Self-pay | Admitting: Cardiovascular Disease

## 2010-09-28 ENCOUNTER — Ambulatory Visit (INDEPENDENT_AMBULATORY_CARE_PROVIDER_SITE_OTHER): Payer: PRIVATE HEALTH INSURANCE | Admitting: Cardiovascular Disease

## 2010-09-28 VITALS — BP 140/82 | HR 72 | Ht 68.0 in | Wt 194.0 lb

## 2010-09-28 DIAGNOSIS — E785 Hyperlipidemia, unspecified: Secondary | ICD-10-CM

## 2010-09-28 DIAGNOSIS — I2510 Atherosclerotic heart disease of native coronary artery without angina pectoris: Secondary | ICD-10-CM

## 2010-09-28 NOTE — Patient Instructions (Signed)
Your physician wants you to follow-up in: 12 months.  You will receive a reminder letter in the mail two months in advance. If you don't receive a letter, please call our office to schedule the follow-up appointment.  Your physician recommends that you return for fasting lab work in next week or so--Lipid, Liver, total CK

## 2010-09-28 NOTE — Assessment & Plan Note (Signed)
Continue statin. 

## 2010-09-28 NOTE — Progress Notes (Signed)
History of Present Illness: 60 yo WM with history of CAD, HLD here today for cardiac follow up. He has been followed in the past by Dr. Juanda Chance. He was last seen by Dr. Juanda Chance in September 2011. 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 His ejection fraction was 60% that time. He is a former smoker. He has not smoked since 2009. Lower extremity arterial dopplers October 2011 with ABI of 0.85 on the left and 0.99 on the left.   He tells me that he feels well. No chest pain or SOB. No dizziness, near syncope or syncope. He has been on Crestor. He seems to be having muscle aches in arms,hands and legs. He also describes a cramping in his right leg when walking. This resolves with position changes and rest.   He does not have a primary care physician.    Past Medical History  Diagnosis Date  . HYPERLIPIDEMIA-MIXED   . CAD, NATIVE VESSEL   . Unspecified peripheral vascular disease   . Nephrolithiasis     No past surgical history on file.  Current Outpatient Prescriptions  Medication Sig Dispense Refill  . aspirin 81 MG tablet Take 81 mg by mouth daily.        . rosuvastatin (CRESTOR) 40 MG tablet Take 40 mg by mouth daily.        . tadalafil (CIALIS) 20 MG tablet Take 20 mg by mouth daily as needed.          No Known Allergies  History   Social History  . Marital Status: Married    Spouse Name: N/A    Number of Children: N/A  . Years of Education: N/A   Occupational History  . Not on file.   Social History Main Topics  . Smoking status: Not on file  . Smokeless tobacco: Not on file  . Alcohol Use: Not on file  . Drug Use: Not on file  . Sexually Active: Not on file   Other Topics Concern  . Not on file   Social History Narrative  . No narrative on file    Family History  Problem Relation Age of Onset  . Coronary artery disease      Review of Systems:  As stated in the HPI and otherwise negative.   BP 140/82  Pulse  72  Ht 5\' 8"  (1.727 m)  Wt 194 lb (87.998 kg)  BMI 29.50 kg/m2  Physical Examination: General: Well developed, well nourished, NAD HEENT: OP clear, mucus membranes moist SKIN: warm, dry. No rashes. Neuro: No focal deficits Musculoskeletal: Muscle strength 5/5 all ext Psychiatric: Mood and affect normal Neck: No JVD, no carotid bruits, no thyromegaly, no lymphadenopathy. Lungs:Clear bilaterally, no wheezes, rhonci, crackles Cardiovascular: Regular rate and rhythm. No murmurs, gallops or rubs. Abdomen:Soft. Bowel sounds present. Non-tender.  Extremities: No lower extremity edema. Pulses are 2 + in the bilateral DP/PT.  EKG:NSR, rate 71 bpm.

## 2010-09-28 NOTE — Assessment & Plan Note (Signed)
Stable. He has not been on a beta blocker but does not wish to start. Continue ASA and statin. He has muscle aches. Will check lipids, LFTs and CK. May need to adjust Crestor dose.

## 2010-10-05 ENCOUNTER — Other Ambulatory Visit (INDEPENDENT_AMBULATORY_CARE_PROVIDER_SITE_OTHER): Payer: PRIVATE HEALTH INSURANCE | Admitting: *Deleted

## 2010-10-05 DIAGNOSIS — E785 Hyperlipidemia, unspecified: Secondary | ICD-10-CM

## 2010-10-05 LAB — BASIC METABOLIC PANEL
BUN: 13
CO2: 25
Chloride: 104
Creatinine, Ser: 0.89
GFR calc non Af Amer: >60
Glucose, Bld: 111 — ABNORMAL HIGH
Potassium: 4.2
Sodium: 138

## 2010-10-05 LAB — CBC
HCT: 44.7
Hemoglobin: 15.3
RBC: 5.36
RDW: 13.6
WBC: 9.1

## 2010-10-05 LAB — DIFFERENTIAL
Basophils Absolute: 0.1
Basophils Relative: 1
Eosinophils Relative: 1
Lymphocytes Relative: 30
Monocytes Absolute: 0.9
Monocytes Relative: 7
Neutrophils Relative %: 61

## 2010-10-05 LAB — LIPID PANEL
Cholesterol: 156 mg/dL (ref 0–200)
LDL Cholesterol: 101 mg/dL — ABNORMAL HIGH (ref 0–99)
LDL Cholesterol: 110 — ABNORMAL HIGH
LDL Cholesterol: 143 — ABNORMAL HIGH
Total CHOL/HDL Ratio: 7.5
VLDL: 33
VLDL: 57 — ABNORMAL HIGH

## 2010-10-05 LAB — HEPATIC FUNCTION PANEL
ALT: 19 U/L (ref 0–53)
Albumin: 4.5 g/dL (ref 3.5–5.2)
Alkaline Phosphatase: 71 U/L (ref 39–117)
Total Bilirubin: 0.6 mg/dL (ref 0.3–1.2)

## 2010-10-05 LAB — MAGNESIUM: Magnesium: 2.3

## 2010-10-05 LAB — POCT CARDIAC MARKERS
CKMB, poc: 2.7
Troponin i, poc: 0.06 — ABNORMAL HIGH

## 2010-10-05 LAB — PROTIME-INR
INR: 1
Prothrombin Time: 13.3

## 2010-10-05 LAB — HEMOGLOBIN A1C
Hgb A1c MFr Bld: 6
Mean Plasma Glucose: 126

## 2010-10-05 LAB — TROPONIN I: Troponin I: 11.85

## 2010-10-05 LAB — CK TOTAL AND CKMB (NOT AT ARMC)
Relative Index: 14.8 — ABNORMAL HIGH
Total CK: 932 — ABNORMAL HIGH

## 2010-12-21 ENCOUNTER — Other Ambulatory Visit: Payer: Self-pay | Admitting: *Deleted

## 2010-12-21 MED ORDER — ROSUVASTATIN CALCIUM 40 MG PO TABS: 40.0000 mg | ORAL_TABLET | Freq: Every day | ORAL | Status: DC

## 2011-10-04 ENCOUNTER — Ambulatory Visit: Payer: PRIVATE HEALTH INSURANCE | Admitting: Cardiovascular Disease

## 2011-10-22 ENCOUNTER — Ambulatory Visit (INDEPENDENT_AMBULATORY_CARE_PROVIDER_SITE_OTHER): Payer: PRIVATE HEALTH INSURANCE | Admitting: Cardiovascular Disease

## 2011-10-22 ENCOUNTER — Encounter: Payer: Self-pay | Admitting: Cardiovascular Disease

## 2011-10-22 VITALS — BP 121/78 | HR 58 | Ht 66.0 in | Wt 190.0 lb

## 2011-10-22 DIAGNOSIS — I739 Peripheral vascular disease, unspecified: Secondary | ICD-10-CM

## 2011-10-22 DIAGNOSIS — I2510 Atherosclerotic heart disease of native coronary artery without angina pectoris: Secondary | ICD-10-CM

## 2011-10-22 DIAGNOSIS — E785 Hyperlipidemia, unspecified: Secondary | ICD-10-CM

## 2011-10-22 LAB — HEPATIC FUNCTION PANEL
ALT: 23 U/L (ref 0–53)
AST: 22 U/L (ref 0–37)
Alkaline Phosphatase: 66 U/L (ref 39–117)
Total Bilirubin: 0.4 mg/dL (ref 0.3–1.2)

## 2011-10-22 LAB — LIPID PANEL
HDL: 35.1 mg/dL — ABNORMAL LOW (ref >39.00)
Triglycerides: 72 mg/dL (ref 0.0–149.0)

## 2011-10-22 LAB — CK: Total CK: 215 U/L (ref 7–232)

## 2011-10-22 NOTE — Patient Instructions (Addendum)
Your physician wants you to follow-up in:  12 months.  You will receive a reminder letter in the mail two months in advance. If you don't receive a letter, please call our office to schedule the follow-up appointment.  Your physician has requested that you have an echocardiogram. Echocardiography is a painless test that uses sound waves to create images of your heart. It provides your doctor with information about the size and shape of your heart and how well your heart's chambers and valves are working. This procedure takes approximately one hour. There are no restrictions for this procedure.   Your physician has requested that you have an ankle brachial index (ABI). During this test an ultrasound and blood pressure cuff are used to evaluate the arteries that supply the arms and legs with blood. Allow thirty minutes for this exam. There are no restrictions or special instructions.  

## 2011-10-22 NOTE — Progress Notes (Signed)
History of Present Illness: 61 yo WM with history of CAD, HLD here today for cardiac follow up. He has been followed in the past by Dr. Juanda Chance. He was last seen by Dr. Juanda Chance in September 2011. I saw him in September 2012. 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 His ejection fraction was 60% that time. He is a former smoker. He has not smoked since 2009. Lower extremity arterial dopplers October 2011 with ABI of 0.85 on the left and 0.99 on the left.   He tells me that he feels well. No chest pain or SOB. No dizziness, near syncope or syncope. He has been on Crestor and tolerating ok. He does describe cramping in his right leg with walking 200 yards. Resolves with rest. No rest pain or ulcerations. He does have chronic back issues and has had back surgery. He does not know if this is from his back. He does describe some pain in joints of hands and wrists.   Primary Care Physician: Dr. Wynelle Link  Last Lipid Profile:Lipid Panel     Component Value Date/Time   CHOL 156 10/05/2010 0905   TRIG 54.0 10/05/2010 0905   HDL 44.70 10/05/2010 0905   CHOLHDL 3 10/05/2010 0905   VLDL 10.8 10/05/2010 0905   LDLCALC 101* 10/05/2010 0905     Past Medical History  Diagnosis Date  . HYPERLIPIDEMIA-MIXED   . CAD, NATIVE VESSEL   . Unspecified peripheral vascular disease   . Nephrolithiasis     Past Surgical History  Procedure Date  . Lower back surgery     Current Outpatient Prescriptions  Medication Sig Dispense Refill  . aspirin 81 MG tablet Take 81 mg by mouth daily.        . rosuvastatin (CRESTOR) 40 MG tablet Take 1 tablet (40 mg total) by mouth daily.  30 tablet  10  . tadalafil (CIALIS) 20 MG tablet Take 20 mg by mouth daily as needed.          No Known Allergies  History   Social History  . Marital Status: Married    Spouse Name: N/A    Number of Children: N/A  . Years of Education: N/A   Occupational History  . Not on file.   Social  History Main Topics  . Smoking status: Former Games developer  . Smokeless tobacco: Not on file  . Alcohol Use: Not on file  . Drug Use: Not on file  . Sexually Active: Not on file   Other Topics Concern  . Not on file   Social History Narrative  . No narrative on file    Family History  Problem Relation Age of Onset  . Coronary artery disease    . Heart attack Mother   . Heart attack Father   . Heart disease Sister   . Heart attack Sister     Review of Systems:  As stated in the HPI and otherwise negative.   BP 121/78  Pulse 58  Ht 5\' 6"  (1.676 m)  Wt 190 lb (86.183 kg)  BMI 30.67 kg/m2  Physical Examination: General: Well developed, well nourished, NAD HEENT: OP clear, mucus membranes moist SKIN: warm, dry. No rashes. Neuro: No focal deficits Musculoskeletal: Muscle strength 5/5 all ext Psychiatric: Mood and affect normal Neck: No JVD, no carotid bruits, no thyromegaly, no lymphadenopathy. Lungs:Clear bilaterally, no wheezes, rhonci, crackles Cardiovascular: Regular rate and rhythm. No murmurs, gallops or rubs. Abdomen:Soft. Bowel  sounds present. Non-tender.  Extremities: No lower extremity edema. Pulses are difficult to palpate in the bilateral DP/PT.  EKG: Sinus brady, rate 58 bpm.   Assessment and Plan:   1. CAD, NATIVE VESSEL: Stable. He has not been on a beta blocker secondary to bradycardia.  Continue ASA and statin. Will check echo to reassess LV function.   2. HYPERLIPIDEMIA: Continue statin. Will check lipids and LFTs, CK.   3. PAD: He is having more pain in his legs. This is cramping in calf muscles with resolution at rest. Will repeat ABI (0.85 on left and 0.99 right in 2011).

## 2011-10-25 ENCOUNTER — Encounter (INDEPENDENT_AMBULATORY_CARE_PROVIDER_SITE_OTHER): Payer: PRIVATE HEALTH INSURANCE

## 2011-10-25 DIAGNOSIS — I70219 Atherosclerosis of native arteries of extremities with intermittent claudication, unspecified extremity: Secondary | ICD-10-CM

## 2011-10-25 DIAGNOSIS — I739 Peripheral vascular disease, unspecified: Secondary | ICD-10-CM

## 2011-11-01 ENCOUNTER — Other Ambulatory Visit: Payer: Self-pay | Admitting: Gastroenterology

## 2011-11-01 ENCOUNTER — Other Ambulatory Visit (HOSPITAL_COMMUNITY): Payer: PRIVATE HEALTH INSURANCE

## 2011-11-08 ENCOUNTER — Ambulatory Visit (HOSPITAL_COMMUNITY): Payer: PRIVATE HEALTH INSURANCE | Attending: Cardiology | Admitting: Radiology

## 2011-11-08 DIAGNOSIS — I2510 Atherosclerotic heart disease of native coronary artery without angina pectoris: Secondary | ICD-10-CM

## 2011-11-08 DIAGNOSIS — Z87891 Personal history of nicotine dependence: Secondary | ICD-10-CM | POA: Insufficient documentation

## 2011-11-08 DIAGNOSIS — I252 Old myocardial infarction: Secondary | ICD-10-CM | POA: Insufficient documentation

## 2011-11-08 DIAGNOSIS — E785 Hyperlipidemia, unspecified: Secondary | ICD-10-CM | POA: Insufficient documentation

## 2011-11-08 NOTE — Progress Notes (Signed)
Echocardiogram performed.  

## 2011-11-10 ENCOUNTER — Telehealth: Payer: Self-pay | Admitting: Cardiovascular Disease

## 2011-11-10 NOTE — Telephone Encounter (Signed)
Pt's wife calling to let us know pt on the way to er in [place] for chest pains

## 2011-11-11 NOTE — Telephone Encounter (Signed)
Spoke with pt. He is currently in hospital in [place].  States he had a heart attack and cath. He is feeling fine now and will be going home tomorrow. He will have hospital send [place] records and contact us to schedule follow up as directed upon discharge.

## 2011-11-11 NOTE — Telephone Encounter (Signed)
[name], Can we try to call to see how he is doing? Keith [name]

## 2011-11-12 ENCOUNTER — Telehealth: Payer: Self-pay | Admitting: Cardiovascular Disease

## 2011-11-12 MED ORDER — NITROGLYCERIN 0.4 MG SL SUBL: 0.4000 mg | SUBLINGUAL_TABLET | SUBLINGUAL | Status: DC | PRN

## 2011-11-12 NOTE — Telephone Encounter (Signed)
New Problem:    Patient's wife called in needing a refill of the patient's nitroglycerin tabs.

## 2011-11-12 NOTE — Telephone Encounter (Signed)
Fax Received. Refill Completed. Keith [name] (R.M.A)   

## 2011-11-24 ENCOUNTER — Ambulatory Visit (INDEPENDENT_AMBULATORY_CARE_PROVIDER_SITE_OTHER): Payer: PRIVATE HEALTH INSURANCE | Admitting: Physician Assistant

## 2011-11-24 ENCOUNTER — Encounter: Payer: Self-pay | Admitting: Physician Assistant

## 2011-11-24 VITALS — BP 128/80 | Ht 69.0 in | Wt 187.2 lb

## 2011-11-24 DIAGNOSIS — E785 Hyperlipidemia, unspecified: Secondary | ICD-10-CM

## 2011-11-24 DIAGNOSIS — I2119 ST elevation (STEMI) myocardial infarction involving other coronary artery of inferior wall: Secondary | ICD-10-CM

## 2011-11-24 DIAGNOSIS — I2510 Atherosclerotic heart disease of native coronary artery without angina pectoris: Secondary | ICD-10-CM

## 2011-11-24 MED ORDER — TICAGRELOR 90 MG PO TABS: 90.0000 mg | ORAL_TABLET | Freq: Two times a day (BID) | ORAL | Status: DC

## 2011-11-24 NOTE — Patient Instructions (Addendum)
A REFILL FOR BRILINTA HAS BEEN SENT IN TODAY TO [facility] ON [address] IN [place]  LAB TODAY; BMET  Your physician recommends that you schedule a follow-up appointment in: 01/17/12 @ 10:30 WITH DR. Clifton Jamesrobert

## 2011-11-24 NOTE — Progress Notes (Signed)
[address] [place], [place]  [address] Phone: [phone], Fax:  [phone]  Date:  11/24/2011   Name:  Keith [name]   DOB:  [birth year]   MRN:  [id number]  PCP:  Leanor Rubenstein, MD  Primary Cardiologist:  Dr. Verne Carrow  Primary Electrophysiologist:  None    History of Present Illness: Keith [name] is a 61 y.o. male who returns for followup after recent admission to a hospital in [place], [place] for an acute inferolateral STEMI.  He has a hx of CAD, HL. Patient underwent LHC in the setting of inferior STEMI in 10/09 demonstrating a mRCA 99%, pLAD 70% and an EF of 60%. He underwent placement of a BMS to the RCA at that time. He also has a hx of PAD with mildly abnormal ABIs in the past. He was recently seen by Dr. Verne Carrow last month.  An echocardiogram was ordered at that visit. Echo 11/08/11: EF 55-60%, normal wall motion, normal diastolic function. Patient was admitted 11/6-11/8 to [facility] in [place] with an acute inferolateral STEMI. I do not have a copy of the cardiac catheterization report. Discharge summary indicates he underwent emergent cardiac catheterization with stenting of the CFX which was totally occluded. A Resolute DES was used. His EF was 50% with mild lateral HK.  Doing well post MI.  No further CP.  No dyspnea.  No orthopnea, PND, edema.  Right radial approach was used.  No complaints.    Labs (11/13):   Creatinine 0.72, LDL 76   Wt Readings from Last 3 Encounters:  11/24/11 187 lb 3.2 oz (84.913 kg)  10/22/11 190 lb (86.183 kg)  09/28/10 194 lb (87.998 kg)     Past Medical History  Diagnosis Date  . HYPERLIPIDEMIA-MIXED   . CAD, NATIVE VESSEL     a. LHC in the setting of inferior STEMI in 10/09 demonstrating a mid RCA 99%, proximal LAD 70% and an EF of 60%. He underwent placement of a BMS to the RCA at that time;  b. Inf-Lat [id number] [facility]): s/p  3x22 Resolute DES to CFX, EF 50% with mild lat HK  . PAD (peripheral artery disease)   . Nephrolithiasis   . Hx of echocardiogram     a. Echo 11/08/11: EF 55-60%, normal wall motion, normal diastolic function.     Current Outpatient Prescriptions  Medication Sig Dispense Refill  . enalapril (VASOTEC) 2.5 MG tablet Take 2.5 mg by mouth daily.      . metoprolol (LOPRESSOR) 50 MG tablet Take 50 mg by mouth 2 (two) times daily.      . Ticagrelor (BRILINTA) 90 MG TABS tablet Take 90 mg by mouth 2 (two) times daily.      Marland Kitchen aspirin 81 MG tablet Take 81 mg by mouth daily.        . nitroGLYCERIN (NITROSTAT) 0.4 MG SL tablet Place 1 tablet (0.4 mg total) under the tongue every 5 (five) minutes as needed for chest pain.  25 tablet  5  . rosuvastatin (CRESTOR) 40 MG tablet Take 1 tablet (40 mg total) by mouth daily.  30 tablet  10  . tadalafil (CIALIS) 20 MG tablet Take 20 mg by mouth daily as needed.          Allergies:  No Known Allergies  Social History:  The patient  reports that he quit smoking about 4 years ago. His smoking use included Cigarettes. He has a 90  pack-year smoking history. He does not have any smokeless tobacco history on file.   ROS:  Please see the history of present illness.   All other systems reviewed and negative.   PHYSICAL EXAM: VS:  BP 128/80  Ht 5\' 9"  (1.753 m)  Wt 187 lb 3.2 oz (84.913 kg)  BMI 27.64 kg/m2 Well nourished, well developed, in no acute distress HEENT: normal Neck: no JVD Cardiac:  normal S1, S2; RRR; no murmur Lungs:  clear to auscultation bilaterally, no wheezing, rhonchi or rales Abd: soft, nontender, no hepatomegaly Ext: no edema; right wrist without hematoma or bruit  Skin: warm and dry Neuro:  CNs 2-12 intact, no focal abnormalities noted  EKG:  Sinus brady, HR 56, noral axis, inf TWI      ASSESSMENT AND PLAN:  1. Coronary Artery Disease:   Doing well after recent inf-lat STEMI at outside hospital.  He brought the CD from his LHC.  I  reviewed this with Dr. Verne Carrow.  Looks like he had a totally occluded CFX.  Also appears to have some disease in the LAD.  Of note, he had a lot of questions today regarding why his most recent echo did not show any warning signs for his MI.  We had a long discussion regarding what an echocardiogram shows and how it would not help [place] predict an MI.  I answered all of his questions today.    -  I will request a cath report for his chart.      -  His CD will be scanned in at the cath lab at [facility].      -  We discussed the importance of dual antiplatelet therapy.     -  He is not interested in cardiac rehab.      -  Continue beta blocker and ACE given WMA on cath with low normal EF.  2. Hyperlipidemia:  Continue Crestor.   3. Disposition:   Follow up with Dr. Verne Carrow in 6-8 weeks.   Luna Glasgow, PA-C  4:00 PM 11/24/2011

## 2011-11-25 LAB — BASIC METABOLIC PANEL
BUN: 19 mg/dL (ref 6–23)
CO2: 30 mEq/L (ref 19–32)
Calcium: 9.8 mg/dL (ref 8.4–10.5)
Creatinine, Ser: 0.9 mg/dL (ref 0.4–1.5)

## 2011-11-26 ENCOUNTER — Telehealth: Payer: Self-pay | Admitting: *Deleted

## 2011-11-26 NOTE — Telephone Encounter (Signed)
Message copied by Tarri Fuller on Fri Nov 26, 2011  8:57 AM ------      Message from: [place], [place] T      Created: Thu Nov 25, 2011  5:23 PM       Potassium and kidney function look good.      Continue with current treatment plan.      Tereso Newcomer, PA-C  4:49 PM 11/18/2011

## 2011-11-26 NOTE — Telephone Encounter (Signed)
pt notified about lab results w/verbal understanding today 

## 2012-01-08 ENCOUNTER — Other Ambulatory Visit: Payer: Self-pay | Admitting: Cardiovascular Disease

## 2012-01-17 ENCOUNTER — Ambulatory Visit (INDEPENDENT_AMBULATORY_CARE_PROVIDER_SITE_OTHER): Payer: PRIVATE HEALTH INSURANCE | Admitting: Cardiovascular Disease

## 2012-01-17 ENCOUNTER — Encounter: Payer: Self-pay | Admitting: Cardiovascular Disease

## 2012-01-17 VITALS — BP 112/74 | HR 62 | Ht 67.0 in | Wt 185.0 lb

## 2012-01-17 DIAGNOSIS — I2510 Atherosclerotic heart disease of native coronary artery without angina pectoris: Secondary | ICD-10-CM

## 2012-01-17 DIAGNOSIS — I739 Peripheral vascular disease, unspecified: Secondary | ICD-10-CM

## 2012-01-17 MED ORDER — METOPROLOL TARTRATE 25 MG PO TABS: 25.0000 mg | ORAL_TABLET | Freq: Two times a day (BID) | ORAL | Status: DC

## 2012-01-17 NOTE — Patient Instructions (Signed)
Your physician wants you to follow-up in: 6 months. You will receive a reminder letter in the mail two months in advance. If you don't receive a letter, please call our office to schedule the follow-up appointment.  Your physician has recommended you make the following change in your medication:  Decrease lopressor to 25 mg by mouth twice daily

## 2012-01-17 NOTE — Progress Notes (Signed)
History of Present Illness: 62 yo WM with history of CAD, HLD here today for cardiac follow up. He has been followed in the past by Dr. Juanda Chance. 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 His ejection fraction was 60% that time. He is a former smoker. He has not smoked since 2009. Lower extremity arterial dopplers October 2011 with ABI of 0.85 on the left and 0.99 on the left. I last saw him October 2013 and he was doing well. He was however admitted 11/6-11/8/13 to [facility] in [place] with an acute inferolateral STEMI secondary to an occluded left Circumflex treated with a 3.0 x22 Resolute Integrity DES post-dilated to 3.5 mm. His EF was 50% with mild lateral HK. RCA stent patent with mild restenosis. LAD with mild 25% plaque mid.   He tells me that he feels well. No chest pain or SOB. Overall feeling well. Occasional dizziness. No near syncope or syncope. He had not tolerated beta blockers in past secondary to bradycardia but has been on Lopressor since his MI. He is trying to exercise and is eating a heart healthy diet.   Primary Care Physician: Dr. Wynelle Link  Last Lipid Profile:Lipid Panel     Component Value Date/Time   CHOL 115 10/22/2011 0936   TRIG 72.0 10/22/2011 0936   HDL 35.10* 10/22/2011 0936   CHOLHDL 3 10/22/2011 0936   VLDL 14.4 10/22/2011 0936   LDLCALC 66 10/22/2011 0936     Past Medical History  Diagnosis Date  . HYPERLIPIDEMIA-MIXED   . CAD, NATIVE VESSEL     a. LHC in the setting of inferior STEMI in 10/09 demonstrating a mid RCA 99%, proximal LAD 70% and an EF of 60%. He underwent placement of a BMS to the RCA at that time;  b. Inf-Lat [id number] [facility]): s/p 3x22 Resolute DES to CFX, EF 50% with mild lat HK  . PAD (peripheral artery disease)   . Nephrolithiasis   . Hx of echocardiogram     a. Echo 11/08/11: EF 55-60%, normal wall motion, normal diastolic function.      Past Surgical History  Procedure Date  . Lower back surgery     Current Outpatient Prescriptions  Medication Sig Dispense Refill  . aspirin 81 MG tablet Take 81 mg by mouth daily.        . CRESTOR 40 MG tablet TAKE ONE TABLET BY MOUTH EVERY DAY  30 tablet  9  . enalapril (VASOTEC) 2.5 MG tablet Take 2.5 mg by mouth daily.      . metoprolol (LOPRESSOR) 50 MG tablet Take 50 mg by mouth 2 (two) times daily.      . nitroGLYCERIN (NITROSTAT) 0.4 MG SL tablet Place 1 tablet (0.4 mg total) under the tongue every 5 (five) minutes as needed for chest pain.  25 tablet  5  . Ticagrelor (BRILINTA) 90 MG TABS tablet Take 1 tablet (90 mg total) by mouth 2 (two) times daily.  60 tablet  11    No Known Allergies  History   Social History  . Marital Status: Married    Spouse Name: N/A    Number of Children: N/A  . Years of Education: N/A   Occupational History  . Not on file.   Social History Main Topics  . Smoking status: Former Smoker -- 2.0 packs/day for 45 years    Types: Cigarettes    Quit date:  10/24/2007  . Smokeless tobacco: Not on file  . Alcohol Use: Not on file  . Drug Use: Not on file  . Sexually Active: Not on file   Other Topics Concern  . Not on file   Social History Narrative  . No narrative on file    Family History  Problem Relation Age of Onset  . Coronary artery disease    . Heart attack Mother   . Heart attack Father   . Heart disease Sister   . Heart attack Sister     Review of Systems:  As stated in the HPI and otherwise negative.   BP 112/74  Pulse 62  Ht 5\' 7"  (1.702 m)  Wt 185 lb (83.915 kg)  BMI 28.97 kg/m2  SpO2 99%  Physical Examination: General: Well developed, well nourished, NAD HEENT: OP clear, mucus membranes moist SKIN: warm, dry. No rashes. Neuro: No focal deficits Musculoskeletal: Muscle strength 5/5 all ext Psychiatric: Mood and affect normal Neck: No JVD, no carotid bruits, no thyromegaly, no  lymphadenopathy. Lungs:Clear bilaterally, no wheezes, rhonci, crackles Cardiovascular: Regular rate and rhythm. No murmurs, gallops or rubs. Abdomen:Soft. Bowel sounds present. Non-tender.  Extremities: No lower extremity edema. Pulses are trace to 1+ in the bilateral DP/PT.  Assessment and Plan:   1. CAD, NATIVE VESSEL: Stable. Recent lateral MI with occluded Circumflex treated with DES. He is on dual anti-platelet therapy. With his bradycardia, fatigue and dizziness will reduce Lopressor to 25 mg po BID.  Continue ASA and Brilinta. Continue statin/beta blocker/Ace-inh.   2. HYPERLIPIDEMIA: Continue statin. Lipids well controlled.   3. PAD: ABI October 2013 stable, 0.91 right and 0.81 left.

## 2012-03-13 ENCOUNTER — Other Ambulatory Visit: Payer: Self-pay | Admitting: *Deleted

## 2012-03-13 DIAGNOSIS — I2510 Atherosclerotic heart disease of native coronary artery without angina pectoris: Secondary | ICD-10-CM

## 2012-03-13 MED ORDER — METOPROLOL TARTRATE 25 MG PO TABS: 25.0000 mg | ORAL_TABLET | Freq: Two times a day (BID) | ORAL | Status: DC

## 2012-05-02 ENCOUNTER — Other Ambulatory Visit: Payer: Self-pay

## 2012-05-02 MED ORDER — ENALAPRIL MALEATE 2.5 MG PO TABS: 2.5000 mg | ORAL_TABLET | Freq: Every day | ORAL | Status: DC

## 2012-05-09 ENCOUNTER — Other Ambulatory Visit: Payer: Self-pay

## 2012-05-09 DIAGNOSIS — I2510 Atherosclerotic heart disease of native coronary artery without angina pectoris: Secondary | ICD-10-CM

## 2012-05-09 MED ORDER — TICAGRELOR 90 MG PO TABS: 90.0000 mg | ORAL_TABLET | Freq: Two times a day (BID) | ORAL | Status: DC

## 2012-07-10 ENCOUNTER — Encounter: Payer: Self-pay | Admitting: Cardiovascular Disease

## 2012-07-10 ENCOUNTER — Telehealth: Payer: Self-pay | Admitting: Cardiovascular Disease

## 2012-07-10 ENCOUNTER — Ambulatory Visit (INDEPENDENT_AMBULATORY_CARE_PROVIDER_SITE_OTHER): Payer: BC Managed Care – PPO | Admitting: Cardiovascular Disease

## 2012-07-10 VITALS — BP 122/78 | HR 64 | Ht 67.0 in | Wt 184.6 lb

## 2012-07-10 DIAGNOSIS — E7800 Pure hypercholesterolemia, unspecified: Secondary | ICD-10-CM

## 2012-07-10 DIAGNOSIS — I2510 Atherosclerotic heart disease of native coronary artery without angina pectoris: Secondary | ICD-10-CM

## 2012-07-10 DIAGNOSIS — I739 Peripheral vascular disease, unspecified: Secondary | ICD-10-CM

## 2012-07-10 MED ORDER — ATORVASTATIN CALCIUM 20 MG PO TABS: 20.0000 mg | ORAL_TABLET | Freq: Every day | ORAL | Status: DC

## 2012-07-10 NOTE — Patient Instructions (Addendum)
Your physician recommends that you schedule a follow-up appointment in:  December 2014 with Dr. Clifton Tyrelle.   Your physician has recommended you make the following change in your medication:  Stop Crestor. Start atorvastatin 20 mg by mouth daily   Your physician recommends that you return for fasting lab work in: December on day of appt with Dr. Clifton Deandrae

## 2012-07-10 NOTE — Telephone Encounter (Signed)
New Problem:    Patient called in wanting to have labs done today with his appointment.  Please call back.

## 2012-07-10 NOTE — Telephone Encounter (Signed)
Spoke with pt who is asking if he is due for fasting lab work today.  I reviewed chart and told pt he will be due for fasting lipid and liver profiles again in October 2014

## 2012-07-10 NOTE — Progress Notes (Signed)
History of Present Illness: 62 yo WM with history of CAD, HLD here today for cardiac follow up. He has been followed in the past by Dr. Juanda Chance. 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 His ejection fraction was 60% that time. He is a former smoker. He has not smoked since 2009. Lower extremity arterial dopplers October 2011 with ABI of 0.85 on the left and 0.99 on the left. He was admitted 11/6-11/8/13 to [facility] in [place] with an acute inferolateral STEMI secondary to an occluded left Circumflex treated with a 3.0 x22 Resolute Integrity DES post-dilated to 3.5 mm. His EF was 50% with mild lateral HK. RCA stent patent with mild restenosis. LAD with mild 25% plaque mid.   He is here today for follow up. He tells me that he feels well. No chest pain or SOB. Fatigue improved with lower dose of Metoprolol. No near syncope or syncope. He is trying to exercise and is eating a heart healthy diet.   Primary Care Physician: Dr. Wynelle Link  Last Lipid Profile:Lipid Panel     Component Value Date/Time   CHOL 115 10/22/2011 0936   TRIG 72.0 10/22/2011 0936   HDL 35.10* 10/22/2011 0936   CHOLHDL 3 10/22/2011 0936   VLDL 14.4 10/22/2011 0936   LDLCALC 66 10/22/2011 0936     Past Medical History  Diagnosis Date  . HYPERLIPIDEMIA-MIXED   . CAD, NATIVE VESSEL     a. LHC in the setting of inferior STEMI in 10/09 demonstrating a mid RCA 99%, proximal LAD 70% and an EF of 60%. He underwent placement of a BMS to the RCA at that time;  b. Inf-Lat [id number] [facility]): s/p 3x22 Resolute DES to CFX, EF 50% with mild lat HK  . PAD (peripheral artery disease)   . Nephrolithiasis   . Hx of echocardiogram     a. Echo 11/08/11: EF 55-60%, normal wall motion, normal diastolic function.     Past Surgical History  Procedure Laterality Date  . Lower back surgery      Current Outpatient Prescriptions    Medication Sig Dispense Refill  . aspirin 81 MG tablet Take 81 mg by mouth daily.        . CRESTOR 40 MG tablet TAKE ONE TABLET BY MOUTH EVERY DAY  30 tablet  9  . enalapril (VASOTEC) 2.5 MG tablet Take 1 tablet (2.5 mg total) by mouth daily.  30 tablet  2  . metoprolol tartrate (LOPRESSOR) 25 MG tablet Take 1 tablet (25 mg total) by mouth 2 (two) times daily.  60 tablet  11  . nitroGLYCERIN (NITROSTAT) 0.4 MG SL tablet Place 1 tablet (0.4 mg total) under the tongue every 5 (five) minutes as needed for chest pain.  25 tablet  5  . Ticagrelor (BRILINTA) 90 MG TABS tablet Take 1 tablet (90 mg total) by mouth 2 (two) times daily.  60 tablet  11   No current facility-administered medications for this visit.    No Known Allergies  History   Social History  . Marital Status: Married    Spouse Name: N/A    Number of Children: N/A  . Years of Education: N/A   Occupational History  . Not on file.   Social History Main Topics  . Smoking status: Former Smoker -- 2.00 packs/day for 45 years    Types: Cigarettes    Quit date: 10/24/2007  .  Smokeless tobacco: Not on file  . Alcohol Use: Not on file  . Drug Use: Not on file  . Sexually Active: Not on file   Other Topics Concern  . Not on file   Social History Narrative  . No narrative on file    Family History  Problem Relation Age of Onset  . Coronary artery disease    . Heart attack Mother   . Heart attack Father   . Heart disease Sister   . Heart attack Sister     Review of Systems:  As stated in the HPI and otherwise negative.   BP 122/78  Pulse 64  Ht 5\' 7"  (1.702 m)  Wt 184 lb 9.6 oz (83.734 kg)  BMI 28.91 kg/m2  Physical Examination: General: Well developed, well nourished, NAD HEENT: OP clear, mucus membranes moist SKIN: warm, dry. No rashes. Neuro: No focal deficits Musculoskeletal: Muscle strength 5/5 all ext Psychiatric: Mood and affect normal Neck: No JVD, no carotid bruits, no thyromegaly, no  lymphadenopathy. Lungs:Clear bilaterally, no wheezes, rhonci, crackles Cardiovascular: Regular rate and rhythm. No murmurs, gallops or rubs. Abdomen:Soft. Bowel sounds present. Non-tender.  Extremities: No lower extremity edema. Pulses are 2 + in the bilateral DP/PT.  Assessment and Plan:   1. CAD, NATIVE VESSEL: Stable. Lateral MI November 2013 with occluded Circumflex treated with DES. He is on dual anti-platelet therapy.Continue ASA and Brilinta. Continue statin/beta blocker/Ace-inh. Will stop Brilinta after November 2013.   2. HYPERLIPIDEMIA: Continue statin. Lipids well controlled. Due to insurance changes, will change Crestor to atorvastatin 20 mg po QHS.   3. PAD: ABI October 2013 stable, 0.91 right and 0.81 left.

## 2012-08-07 ENCOUNTER — Other Ambulatory Visit: Payer: Self-pay | Admitting: *Deleted

## 2012-08-07 MED ORDER — ENALAPRIL MALEATE 2.5 MG PO TABS: 2.5000 mg | ORAL_TABLET | Freq: Every day | ORAL | Status: DC

## 2012-09-12 ENCOUNTER — Other Ambulatory Visit: Payer: Self-pay

## 2012-09-12 DIAGNOSIS — E7800 Pure hypercholesterolemia, unspecified: Secondary | ICD-10-CM

## 2012-09-12 MED ORDER — ATORVASTATIN CALCIUM 20 MG PO TABS: 20.0000 mg | ORAL_TABLET | Freq: Every day | ORAL | Status: DC

## 2012-09-22 ENCOUNTER — Other Ambulatory Visit: Payer: Self-pay

## 2012-09-22 DIAGNOSIS — E7800 Pure hypercholesterolemia, unspecified: Secondary | ICD-10-CM

## 2012-09-22 DIAGNOSIS — I2510 Atherosclerotic heart disease of native coronary artery without angina pectoris: Secondary | ICD-10-CM

## 2012-09-22 MED ORDER — METOPROLOL TARTRATE 25 MG PO TABS: 25.0000 mg | ORAL_TABLET | Freq: Two times a day (BID) | ORAL | Status: DC

## 2012-09-22 MED ORDER — ENALAPRIL MALEATE 2.5 MG PO TABS: 2.5000 mg | ORAL_TABLET | Freq: Every day | ORAL | Status: DC

## 2012-09-22 MED ORDER — ATORVASTATIN CALCIUM 20 MG PO TABS: 20.0000 mg | ORAL_TABLET | Freq: Every day | ORAL | Status: DC

## 2012-09-24 IMAGING — CR DG ABDOMEN 1V
1 series · 1 of 1 positions shown · non-contrast
Comparison: 04/26/2009 and earlier.

CLINICAL DATA: 60-year-old male with left flank pain times 2 days.
Kidney stones.

ABDOMEN - 1 VIEW

[view not recorded]
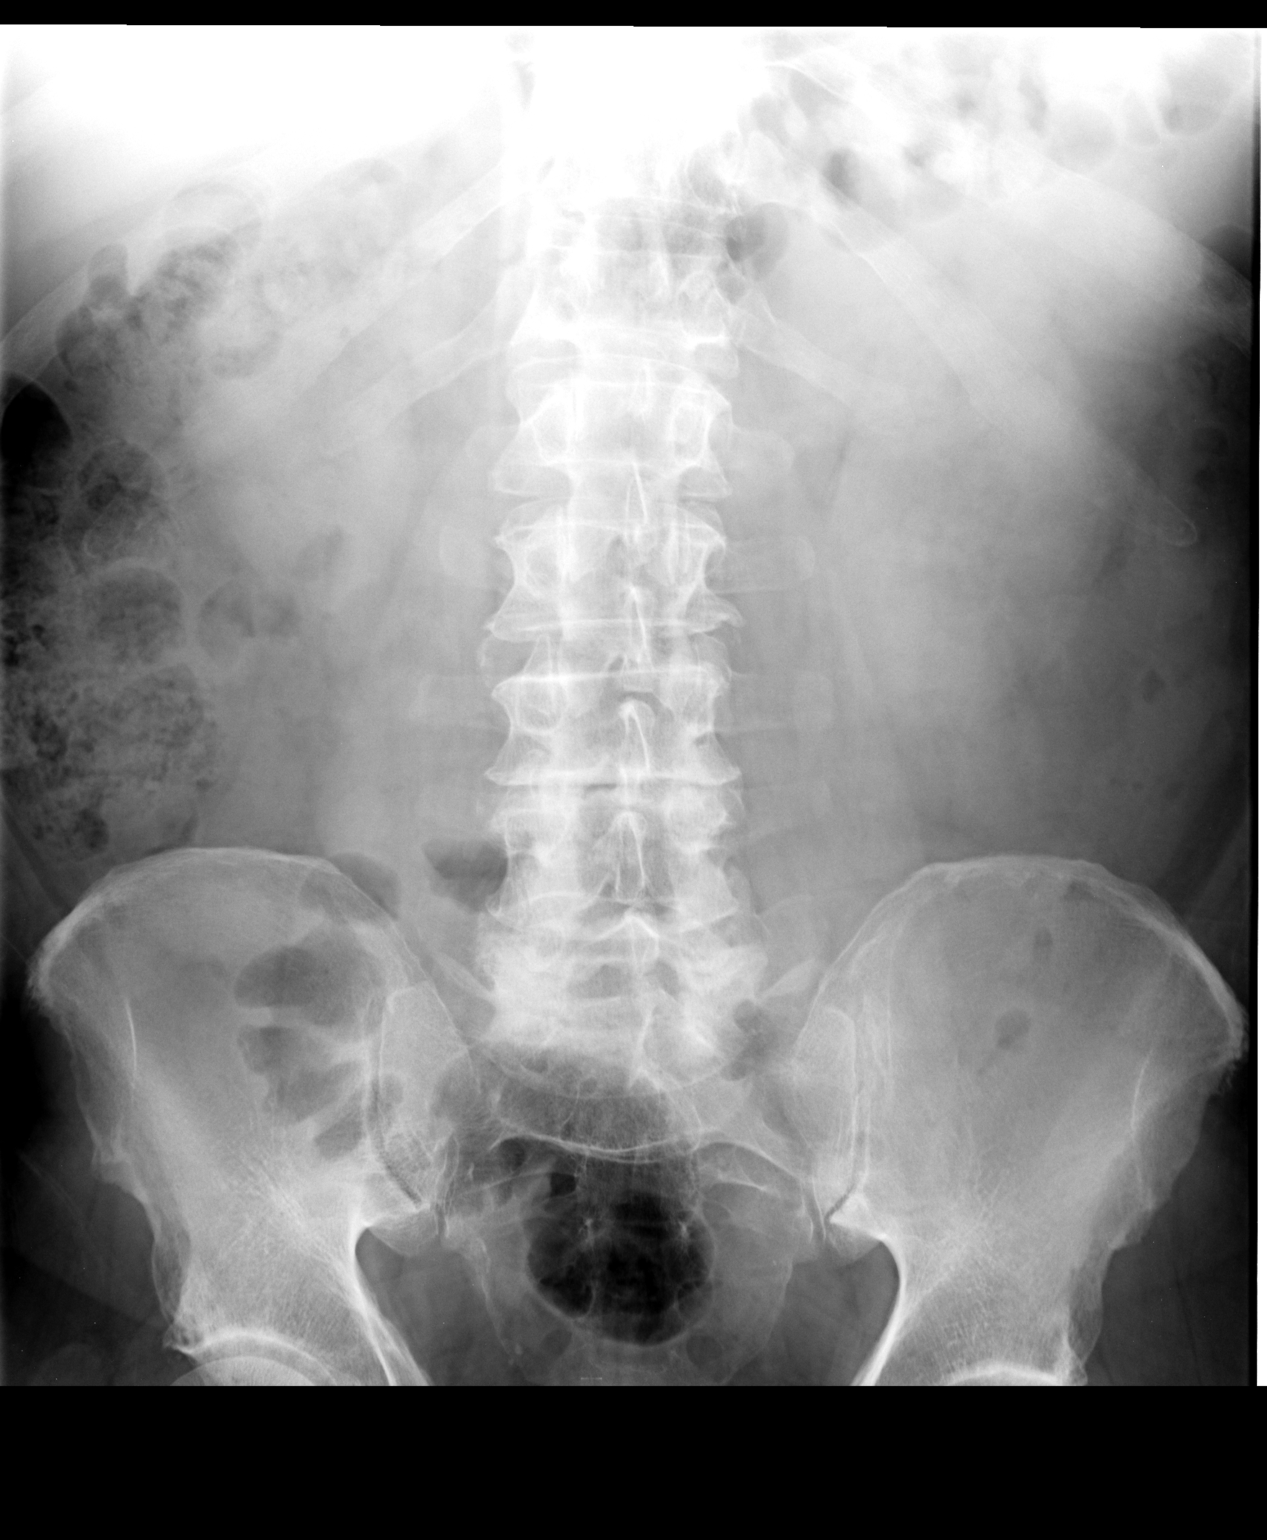

[1 of 1 positions shown; findings below may reference images not displayed]

FINDINGS: Nonobstructed bowel gas pattern.  Abdominal and pelvic
visceral contours are within normal limits.  No radiopaque urologic
calculus identified.  Stable punctate right hemi pelvic phleboliths
projecting partially over the sacrum.  Degenerative changes in the
lumbar spine. No acute osseous abnormality identified.
IMPRESSION: No radiopaque urologic calculi or acute findings on KUB.

## 2012-11-02 ENCOUNTER — Other Ambulatory Visit: Payer: Self-pay | Admitting: *Deleted

## 2012-11-02 DIAGNOSIS — I2510 Atherosclerotic heart disease of native coronary artery without angina pectoris: Secondary | ICD-10-CM

## 2012-11-02 DIAGNOSIS — E7800 Pure hypercholesterolemia, unspecified: Secondary | ICD-10-CM

## 2012-11-02 MED ORDER — ATORVASTATIN CALCIUM 20 MG PO TABS: 20.0000 mg | ORAL_TABLET | Freq: Every day | ORAL | Status: DC

## 2012-11-02 MED ORDER — ENALAPRIL MALEATE 2.5 MG PO TABS: 2.5000 mg | ORAL_TABLET | Freq: Every day | ORAL | Status: DC

## 2012-11-02 MED ORDER — METOPROLOL TARTRATE 25 MG PO TABS: 25.0000 mg | ORAL_TABLET | Freq: Two times a day (BID) | ORAL | Status: DC

## 2012-11-09 ENCOUNTER — Other Ambulatory Visit: Payer: Self-pay

## 2012-12-22 ENCOUNTER — Ambulatory Visit (INDEPENDENT_AMBULATORY_CARE_PROVIDER_SITE_OTHER): Payer: BC Managed Care – PPO | Admitting: Cardiovascular Disease

## 2012-12-22 ENCOUNTER — Encounter: Payer: Self-pay | Admitting: Cardiovascular Disease

## 2012-12-22 VITALS — BP 120/76 | HR 55 | Ht 67.0 in | Wt 186.0 lb

## 2012-12-22 DIAGNOSIS — E7800 Pure hypercholesterolemia, unspecified: Secondary | ICD-10-CM

## 2012-12-22 DIAGNOSIS — I2510 Atherosclerotic heart disease of native coronary artery without angina pectoris: Secondary | ICD-10-CM

## 2012-12-22 DIAGNOSIS — I739 Peripheral vascular disease, unspecified: Secondary | ICD-10-CM

## 2012-12-22 LAB — HEPATIC FUNCTION PANEL
Albumin: 4.6 g/dL (ref 3.5–5.2)
Alkaline Phosphatase: 78 U/L (ref 39–117)
Total Protein: 7.4 g/dL (ref 6.0–8.3)

## 2012-12-22 LAB — LIPID PANEL
Cholesterol: 149 mg/dL (ref 0–200)
LDL Cholesterol: 96 mg/dL (ref 0–99)
Triglycerides: 91 mg/dL (ref 0.0–149.0)

## 2012-12-22 MED ORDER — CLOPIDOGREL BISULFATE 75 MG PO TABS: 75.0000 mg | ORAL_TABLET | Freq: Every day | ORAL | Status: DC

## 2012-12-22 NOTE — Progress Notes (Signed)
History of Present Illness: 62 yo WM with history of CAD, HLD here today for cardiac follow up. He has been followed in the past by Dr. Juanda Chance. 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 His ejection fraction was 60% that time. He is a former smoker. He has not smoked since 2009. Lower extremity arterial dopplers October 2011 with ABI of 0.85 on the left and 0.99 on the left. He was admitted 11/6-11/8/13 to [facility] in [place] with an acute inferolateral STEMI secondary to an occluded left Circumflex treated with a 3.0 x22 Resolute Integrity DES post-dilated to 3.5 mm. His EF was 50% with mild lateral HK. RCA stent patent with mild restenosis. LAD with mild 25% plaque mid.   He is here today for follow up. He tells me that he feels well. No chest pain or SOB. No near syncope or syncope. He is eating a heart healthy diet.   Primary Care Physician: Dr. Wynelle Link  Last Lipid Profile:Lipid Panel     Component Value Date/Time   CHOL 115 10/22/2011 0936   TRIG 72.0 10/22/2011 0936   HDL 35.10* 10/22/2011 0936   CHOLHDL 3 10/22/2011 0936   VLDL 14.4 10/22/2011 0936   LDLCALC 66 10/22/2011 0936     Past Medical History  Diagnosis Date  . HYPERLIPIDEMIA-MIXED   . CAD, NATIVE VESSEL     a. LHC in the setting of inferior STEMI in 10/09 demonstrating a mid RCA 99%, proximal LAD 70% and an EF of 60%. He underwent placement of a BMS to the RCA at that time;  b. Inf-Lat [id number] [facility]): s/p 3x22 Resolute DES to CFX, EF 50% with mild lat HK  . PAD (peripheral artery disease)   . Nephrolithiasis   . Hx of echocardiogram     a. Echo 11/08/11: EF 55-60%, normal wall motion, normal diastolic function.     Past Surgical History  Procedure Laterality Date  . Lower back surgery      Current Outpatient Prescriptions  Medication Sig Dispense Refill  . aspirin 81 MG tablet Take 81 mg by mouth  daily.        Marland Kitchen atorvastatin (LIPITOR) 20 MG tablet Take 1 tablet (20 mg total) by mouth daily.  90 tablet  0  . enalapril (VASOTEC) 2.5 MG tablet Take 1 tablet (2.5 mg total) by mouth daily.  90 tablet  0  . metoprolol tartrate (LOPRESSOR) 25 MG tablet Take 1 tablet (25 mg total) by mouth 2 (two) times daily.  180 tablet  0  . nitroGLYCERIN (NITROSTAT) 0.4 MG SL tablet Place 1 tablet (0.4 mg total) under the tongue every 5 (five) minutes as needed for chest pain.  25 tablet  5  . Ticagrelor (BRILINTA) 90 MG TABS tablet Take 1 tablet (90 mg total) by mouth 2 (two) times daily.  60 tablet  11   No current facility-administered medications for this visit.    No Known Allergies  History   Social History  . Marital Status: Married    Spouse Name: N/A    Number of Children: N/A  . Years of Education: N/A   Occupational History  . Not on file.   Social History Main Topics  . Smoking status: Former Smoker -- 2.00 packs/day for 45 years    Types: Cigarettes    Quit date: 10/24/2007  . Smokeless tobacco: Not on file  .  Alcohol Use: Not on file  . Drug Use: Not on file  . Sexual Activity: Not on file   Other Topics Concern  . Not on file   Social History Narrative  . No narrative on file    Family History  Problem Relation Age of Onset  . Coronary artery disease    . Heart attack Mother   . Heart attack Father   . Heart disease Sister   . Heart attack Sister     Review of Systems:  As stated in the HPI and otherwise negative.   BP 120/76  Pulse 55  Ht 5\' 7"  (1.702 m)  Wt 186 lb (84.369 kg)  BMI 29.12 kg/m2  Physical Examination: General: Well developed, well nourished, NAD HEENT: OP clear, mucus membranes moist SKIN: warm, dry. No rashes. Neuro: No focal deficits Musculoskeletal: Muscle strength 5/5 all ext Psychiatric: Mood and affect normal Neck: No JVD, no carotid bruits, no thyromegaly, no lymphadenopathy. Lungs:Clear bilaterally, no wheezes, rhonci,  crackles Cardiovascular: Regular rate and rhythm. No murmurs, gallops or rubs. Abdomen:Soft. Bowel sounds present. Non-tender.  Extremities: No lower extremity edema. Pulses are 2 + in the bilateral DP/PT.  Assessment and Plan:   1. CAD, NATIVE VESSEL: Stable. Lateral MI November 2013 with occluded Circumflex treated with DES. He is on ASA and Brilinta. Based on new recommendations following the DAPT trial, will stop Brilinta but he will need long term dual anti-platelet therapy. Will start Plavix 75 mg po Qdaily and continue ASA. Continue statin/beta blocker/Ace-inh.   2. HYPERLIPIDEMIA: Continue statin. Lipids well controlled.  Will check lipids and LFTs today. l  3. PAD: ABI October 2013 stable, 0.91 right and 0.81 left.

## 2012-12-22 NOTE — Patient Instructions (Signed)
Your physician wants you to follow-up in:  6 months. . You will receive a reminder letter in the mail two months in advance. If you don't receive a letter, please call our office to schedule the follow-up appointment.  Your physician has recommended you make the following change in your medication:  Stop Brilinta. Start Clopidogrel 75 mg by mouth daily.      

## 2013-01-19 ENCOUNTER — Other Ambulatory Visit: Payer: Self-pay | Admitting: *Deleted

## 2013-01-19 DIAGNOSIS — E7800 Pure hypercholesterolemia, unspecified: Secondary | ICD-10-CM

## 2013-01-19 DIAGNOSIS — I2510 Atherosclerotic heart disease of native coronary artery without angina pectoris: Secondary | ICD-10-CM

## 2013-01-19 MED ORDER — ATORVASTATIN CALCIUM 20 MG PO TABS: 20.0000 mg | ORAL_TABLET | Freq: Every day | ORAL | Status: DC

## 2013-01-19 MED ORDER — ENALAPRIL MALEATE 2.5 MG PO TABS: 2.5000 mg | ORAL_TABLET | Freq: Every day | ORAL | Status: DC

## 2013-01-19 MED ORDER — METOPROLOL TARTRATE 25 MG PO TABS: 25.0000 mg | ORAL_TABLET | Freq: Two times a day (BID) | ORAL | Status: DC

## 2013-05-25 ENCOUNTER — Other Ambulatory Visit: Payer: Self-pay | Admitting: *Deleted

## 2013-05-25 DIAGNOSIS — E7800 Pure hypercholesterolemia, unspecified: Secondary | ICD-10-CM

## 2013-05-25 DIAGNOSIS — I2510 Atherosclerotic heart disease of native coronary artery without angina pectoris: Secondary | ICD-10-CM

## 2013-05-25 MED ORDER — ENALAPRIL MALEATE 2.5 MG PO TABS: 2.5000 mg | ORAL_TABLET | Freq: Every day | ORAL | Status: DC

## 2013-05-25 MED ORDER — ATORVASTATIN CALCIUM 20 MG PO TABS: 20.0000 mg | ORAL_TABLET | Freq: Every day | ORAL | Status: DC

## 2013-05-25 MED ORDER — CLOPIDOGREL BISULFATE 75 MG PO TABS: 75.0000 mg | ORAL_TABLET | Freq: Every day | ORAL | Status: DC

## 2013-05-25 MED ORDER — METOPROLOL TARTRATE 25 MG PO TABS: 25.0000 mg | ORAL_TABLET | Freq: Two times a day (BID) | ORAL | Status: DC

## 2013-07-26 ENCOUNTER — Other Ambulatory Visit: Payer: Self-pay | Admitting: Cardiovascular Disease

## 2013-07-26 ENCOUNTER — Other Ambulatory Visit: Payer: Self-pay | Admitting: *Deleted

## 2013-07-26 DIAGNOSIS — E7800 Pure hypercholesterolemia, unspecified: Secondary | ICD-10-CM

## 2013-07-26 MED ORDER — CLOPIDOGREL BISULFATE 75 MG PO TABS: 75.0000 mg | ORAL_TABLET | Freq: Every day | ORAL | Status: DC

## 2013-07-26 MED ORDER — ENALAPRIL MALEATE 2.5 MG PO TABS: 2.5000 mg | ORAL_TABLET | Freq: Every day | ORAL | Status: DC

## 2013-07-26 MED ORDER — ATORVASTATIN CALCIUM 20 MG PO TABS: 20.0000 mg | ORAL_TABLET | Freq: Every day | ORAL | Status: DC

## 2013-07-26 MED ORDER — METOPROLOL TARTRATE 25 MG PO TABS: 25.0000 mg | ORAL_TABLET | Freq: Two times a day (BID) | ORAL | Status: DC

## 2013-07-26 NOTE — Telephone Encounter (Signed)
Pt calling in to get all cardiac meds refilled

## 2013-11-09 ENCOUNTER — Ambulatory Visit (INDEPENDENT_AMBULATORY_CARE_PROVIDER_SITE_OTHER): Payer: Managed Care, Other (non HMO) | Admitting: Cardiovascular Disease

## 2013-11-09 ENCOUNTER — Encounter: Payer: Self-pay | Admitting: Cardiovascular Disease

## 2013-11-09 ENCOUNTER — Other Ambulatory Visit (INDEPENDENT_AMBULATORY_CARE_PROVIDER_SITE_OTHER): Payer: Managed Care, Other (non HMO) | Admitting: *Deleted

## 2013-11-09 VITALS — BP 120/76 | HR 48 | Ht 67.0 in | Wt 186.8 lb

## 2013-11-09 DIAGNOSIS — E7800 Pure hypercholesterolemia, unspecified: Secondary | ICD-10-CM

## 2013-11-09 DIAGNOSIS — I739 Peripheral vascular disease, unspecified: Secondary | ICD-10-CM

## 2013-11-09 DIAGNOSIS — F17201 Nicotine dependence, unspecified, in remission: Secondary | ICD-10-CM

## 2013-11-09 DIAGNOSIS — I2510 Atherosclerotic heart disease of native coronary artery without angina pectoris: Secondary | ICD-10-CM

## 2013-11-09 LAB — HEPATIC FUNCTION PANEL
ALK PHOS: 74 U/L (ref 39–117)
ALT: 12 U/L (ref 0–53)
AST: 17 U/L (ref 0–37)
Albumin: 3.7 g/dL (ref 3.5–5.2)
BILIRUBIN DIRECT: 0 mg/dL (ref 0.0–0.3)
Total Bilirubin: 0.6 mg/dL (ref 0.2–1.2)
Total Protein: 7.2 g/dL (ref 6.0–8.3)

## 2013-11-09 LAB — LIPID PANEL
CHOL/HDL RATIO: 5
Cholesterol: 144 mg/dL (ref 0–200)
HDL: 31.4 mg/dL — ABNORMAL LOW (ref >39.00)
LDL Cholesterol: 96 mg/dL (ref 0–99)
NONHDL: 112.6
TRIGLYCERIDES: 83 mg/dL (ref 0.0–149.0)
VLDL: 16.6 mg/dL (ref 0.0–40.0)

## 2013-11-09 MED ORDER — ATORVASTATIN CALCIUM 20 MG PO TABS: 20.0000 mg | ORAL_TABLET | Freq: Every day | ORAL | Status: DC

## 2013-11-09 MED ORDER — METOPROLOL TARTRATE 25 MG PO TABS: 25.0000 mg | ORAL_TABLET | Freq: Two times a day (BID) | ORAL | Status: DC

## 2013-11-09 MED ORDER — CLOPIDOGREL BISULFATE 75 MG PO TABS: 75.0000 mg | ORAL_TABLET | Freq: Every day | ORAL | Status: DC

## 2013-11-09 MED ORDER — NITROGLYCERIN 0.4 MG SL SUBL: 0.4000 mg | SUBLINGUAL_TABLET | SUBLINGUAL | Status: DC | PRN

## 2013-11-09 MED ORDER — ENALAPRIL MALEATE 2.5 MG PO TABS: 2.5000 mg | ORAL_TABLET | Freq: Every day | ORAL | Status: DC

## 2013-11-09 NOTE — Patient Instructions (Signed)
Your physician wants you to follow-up in: 12 months.  You will receive a reminder letter in the mail two months in advance. If you don't receive a letter, please call our office to schedule the follow-up appointment.  Your physician has requested that you have an exercise tolerance test. For further information please visit [email or web address]. Please also follow instruction sheet, as given.   Lab work to be done today--Lipid and Liver profiles

## 2013-11-09 NOTE — Progress Notes (Signed)
History of Present Illness: 63 yo WM with history of CAD, HLD here today for cardiac follow up. He has been followed in the past by Dr. Olevia Perches. 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 His ejection fraction was 60% that time. He is a former smoker. He has not smoked since 2009. Lower extremity arterial dopplers October 2011 with ABI of 0.85 on the left and 0.99 on the left. He was admitted 11/6-11/8/13 to [facility] in [place] with an acute inferolateral STEMI secondary to an occluded left Circumflex treated with a 3.0 x22 Resolute Integrity DES post-dilated to 3.5 mm. His EF was 50% with mild lateral HK. RCA stent patent with mild restenosis. LAD with mild 25% plaque mid.   He is here today for follow up. He tells me that he feels well. No chest pain or SOB. No near syncope or syncope. He is eating a heart healthy diet. He is very active. He does have chronic back pain.   Primary Care Physician: Dr. Nancy Fetter  Last Lipid Profile:Lipid Panel     Component Value Date/Time   CHOL 149 12/22/2012 0954   TRIG 91.0 12/22/2012 0954   HDL 34.70* 12/22/2012 0954   CHOLHDL 4 12/22/2012 0954   VLDL 18.2 12/22/2012 0954   Goltry 96 12/22/2012 0954    Past Medical History  Diagnosis Date  . HYPERLIPIDEMIA-MIXED   . CAD, NATIVE VESSEL     a. LHC in the setting of inferior STEMI in 10/09 demonstrating a mid RCA 99%, proximal LAD 70% and an EF of 60%. He underwent placement of a BMS to the RCA at that time;  b. Inf-Lat [id number] [facility]): s/p 3x22 Resolute DES to CFX, EF 50% with mild lat HK  . PAD (peripheral artery disease)   . Nephrolithiasis   . Hx of echocardiogram     a. Echo 11/08/11: EF 55-60%, normal wall motion, normal diastolic function.     Past Surgical History  Procedure Laterality Date  . Lower back surgery      Current Outpatient Prescriptions  Medication Sig Dispense Refill   . aspirin 81 MG tablet Take 81 mg by mouth daily.      Marland Kitchen atorvastatin (LIPITOR) 20 MG tablet Take 1 tablet (20 mg total) by mouth daily. 30 tablet 4  . clopidogrel (PLAVIX) 75 MG tablet Take 1 tablet (75 mg total) by mouth daily. 30 tablet 4  . enalapril (VASOTEC) 2.5 MG tablet Take 1 tablet (2.5 mg total) by mouth daily. 30 tablet 4  . metoprolol tartrate (LOPRESSOR) 25 MG tablet Take 1 tablet (25 mg total) by mouth 2 (two) times daily. 60 tablet 4  . nitroGLYCERIN (NITROSTAT) 0.4 MG SL tablet Place 1 tablet (0.4 mg total) under the tongue every 5 (five) minutes as needed for chest pain. 25 tablet 5   No current facility-administered medications for this visit.    No Known Allergies  History   Social History  . Marital Status: Married    Spouse Name: N/A    Number of Children: N/A  . Years of Education: N/A   Occupational History  . Not on file.   Social History Main Topics  . Smoking status: Former Smoker -- 2.00 packs/day for 45 years    Types: Cigarettes    Quit date: 10/24/2007  . Smokeless tobacco: Not on file  . Alcohol Use: Not on file  .  Drug Use: Not on file  . Sexual Activity: Not on file   Other Topics Concern  . Not on file   Social History Narrative    Family History  Problem Relation Age of Onset  . Coronary artery disease    . Heart attack Mother   . Heart attack Father   . Heart disease Sister   . Heart attack Sister     Review of Systems:  As stated in the HPI and otherwise negative.   BP 120/76 mmHg  Pulse 48  Ht 5\' 7"  (1.702 m)  Wt 186 lb 12.8 oz (84.732 kg)  BMI 29.25 kg/m2  Physical Examination: General: Well developed, well nourished, NAD HEENT: OP clear, mucus membranes moist SKIN: warm, dry. No rashes. Neuro: No focal deficits Musculoskeletal: Muscle strength 5/5 all ext Psychiatric: Mood and affect normal Neck: No JVD, no carotid bruits, no thyromegaly, no lymphadenopathy. Lungs:Clear bilaterally, no wheezes, rhonci,  crackles Cardiovascular: Regular rate and rhythm. No murmurs, gallops or rubs. Abdomen:Soft. Bowel sounds present. Non-tender.  Extremities: No lower extremity edema. Pulses are 2 + in the bilateral DP/PT.  EKG: Sinus brady, rate 48 bpm.   Assessment and Plan:   1. CAD, NATIVE VESSEL: Stable. Lateral MI November 2013 with occluded Circumflex treated with DES. Will continue ASA and Plavix. Continue statin/beta blocker/Ace-inh. Plan exercise stress test for screening for ischemia.   2. HYPERLIPIDEMIA: Continue statin. Check lipids and LFTs today.   3. PAD: ABI October 2013 stable, 0.91 right and 0.81 left.   4. Tobacco abuse, in remission: He has not started back smoking

## 2013-11-12 ENCOUNTER — Other Ambulatory Visit: Payer: Self-pay | Admitting: *Deleted

## 2013-11-12 DIAGNOSIS — E7800 Pure hypercholesterolemia, unspecified: Secondary | ICD-10-CM

## 2013-11-12 MED ORDER — ATORVASTATIN CALCIUM 40 MG PO TABS: 40.0000 mg | ORAL_TABLET | Freq: Every day | ORAL | Status: DC

## 2013-12-21 ENCOUNTER — Encounter: Payer: Managed Care, Other (non HMO) | Admitting: Physician Assistant

## 2014-02-01 ENCOUNTER — Other Ambulatory Visit (INDEPENDENT_AMBULATORY_CARE_PROVIDER_SITE_OTHER): Payer: Managed Care, Other (non HMO) | Admitting: *Deleted

## 2014-02-01 ENCOUNTER — Telehealth: Payer: Self-pay | Admitting: Cardiovascular Disease

## 2014-02-01 ENCOUNTER — Ambulatory Visit (INDEPENDENT_AMBULATORY_CARE_PROVIDER_SITE_OTHER): Payer: Managed Care, Other (non HMO) | Admitting: Physician Assistant

## 2014-02-01 DIAGNOSIS — E7800 Pure hypercholesterolemia, unspecified: Secondary | ICD-10-CM

## 2014-02-01 DIAGNOSIS — I2510 Atherosclerotic heart disease of native coronary artery without angina pectoris: Secondary | ICD-10-CM

## 2014-02-01 LAB — HEPATIC FUNCTION PANEL
ALBUMIN: 4.5 g/dL (ref 3.5–5.2)
ALK PHOS: 88 U/L (ref 39–117)
ALT: 21 U/L (ref 0–53)
AST: 22 U/L (ref 0–37)
BILIRUBIN DIRECT: 0.1 mg/dL (ref 0.0–0.3)
BILIRUBIN TOTAL: 0.5 mg/dL (ref 0.2–1.2)
Total Protein: 7.1 g/dL (ref 6.0–8.3)

## 2014-02-01 LAB — LIPID PANEL
CHOL/HDL RATIO: 3
CHOLESTEROL: 141 mg/dL (ref 0–200)
HDL: 41.4 mg/dL (ref >39.00)
LDL Cholesterol: 85 mg/dL (ref 0–99)
NONHDL: 99.6
Triglycerides: 74 mg/dL (ref 0.0–149.0)
VLDL: 14.8 mg/dL (ref 0.0–40.0)

## 2014-02-01 NOTE — Telephone Encounter (Signed)
New problem   Pt returning your call from today. Please call pt.

## 2014-02-01 NOTE — Telephone Encounter (Signed)
Spoke with pt and reviewed lipid and liver results with him.

## 2014-02-01 NOTE — Progress Notes (Signed)
Exercise Treadmill Test  Pre-Exercise Testing Evaluation Rhythm: normal sinus  Rate: 59 bpm     Test  Exercise Tolerance Test Ordering MD: Murvin Natal, MD  Interpreting MD: Richardson Dopp, PA-C  Unique Test No: 1  Treadmill:  1  Indication for ETT: known ASHD  Contraindication to ETT: No   Stress Modality: exercise - treadmill  Cardiac Imaging Performed: non   Protocol: standard Bruce - maximal  Max BP:  224/102  Max MPHR (bpm):  159 85% MPR (bpm):  135  MPHR obtained (bpm):  126 % MPHR obtained:  79  Reached 85% MPHR (min:sec):  n/a Total Exercise Time (min-sec):  8:52  Workload in METS:  10.1 Borg Scale: 13  Reason ETT Terminated:  leg cramps    ST Segment Analysis At Rest: normal ST segments - no evidence of significant ST depression With Exercise: non-specific ST changes  Other Information Arrhythmia:  No Angina during ETT:  absent (0) Quality of ETT:  non-diagnostic  ETT Interpretation:  normal - no evidence of ischemia by ST analysis at sub-maximal exercise.  Comments: Good exercise capacity.  Target HR not achieved. No chest pain. Exaggerated hypertensive BP response to exercise. No ST changes to suggest ischemia at submaximal exercise.   Recommendations: He did not achieve his target HR. However, he walked almost 9 minutes without chest pain or ECG changes. He mainly complains of leg cramps. I will let Dr. Lauree Chandler decide whether pharmacologic stress perfusion study is needed. Signed, Richardson Dopp, PA-C   02/01/2014 11:44 AM

## 2014-02-05 NOTE — Progress Notes (Signed)
pt notified about ETT and no further testing needed at this time, pt said ok and thank you

## 2014-02-05 NOTE — Telephone Encounter (Signed)
pt notified about ETT and no further testing needed at this time, pt said ok and thank you

## 2014-11-24 ENCOUNTER — Other Ambulatory Visit: Payer: Self-pay | Admitting: Cardiovascular Disease

## 2014-11-25 ENCOUNTER — Other Ambulatory Visit: Payer: Self-pay | Admitting: Cardiovascular Disease

## 2014-11-25 MED ORDER — METOPROLOL TARTRATE 25 MG PO TABS: 25.0000 mg | ORAL_TABLET | Freq: Two times a day (BID) | ORAL | Status: DC

## 2014-12-02 ENCOUNTER — Ambulatory Visit (INDEPENDENT_AMBULATORY_CARE_PROVIDER_SITE_OTHER): Payer: Managed Care, Other (non HMO) | Admitting: Cardiovascular Disease

## 2014-12-02 ENCOUNTER — Telehealth: Payer: Self-pay | Admitting: Cardiovascular Disease

## 2014-12-02 ENCOUNTER — Encounter: Payer: Self-pay | Admitting: Cardiovascular Disease

## 2014-12-02 VITALS — BP 120/60 | HR 51 | Ht 68.0 in | Wt 189.8 lb

## 2014-12-02 DIAGNOSIS — I739 Peripheral vascular disease, unspecified: Secondary | ICD-10-CM

## 2014-12-02 DIAGNOSIS — I2510 Atherosclerotic heart disease of native coronary artery without angina pectoris: Secondary | ICD-10-CM

## 2014-12-02 DIAGNOSIS — F17201 Nicotine dependence, unspecified, in remission: Secondary | ICD-10-CM

## 2014-12-02 DIAGNOSIS — E7800 Pure hypercholesterolemia, unspecified: Secondary | ICD-10-CM

## 2014-12-02 MED ORDER — ENALAPRIL MALEATE 2.5 MG PO TABS: 2.5000 mg | ORAL_TABLET | Freq: Every day | ORAL | Status: DC

## 2014-12-02 MED ORDER — NITROGLYCERIN 0.4 MG SL SUBL: 0.4000 mg | SUBLINGUAL_TABLET | SUBLINGUAL | Status: DC | PRN

## 2014-12-02 MED ORDER — CLOPIDOGREL BISULFATE 75 MG PO TABS: 75.0000 mg | ORAL_TABLET | Freq: Every day | ORAL | Status: DC

## 2014-12-02 MED ORDER — ATORVASTATIN CALCIUM 40 MG PO TABS: 40.0000 mg | ORAL_TABLET | Freq: Every day | ORAL | Status: DC

## 2014-12-02 MED ORDER — METOPROLOL TARTRATE 25 MG PO TABS: 25.0000 mg | ORAL_TABLET | Freq: Two times a day (BID) | ORAL | Status: DC

## 2014-12-02 NOTE — Telephone Encounter (Signed)
New message      Pt was seen this am.  He told the doctor that he did not want to have the test on his legs/foot.  He has since changed his mind and ask that it be scheduled.

## 2014-12-02 NOTE — Telephone Encounter (Signed)
Chart reviewed. ABI's discussed at office visit.  Last checked in 2013 and were stable.  I reviewed with [name] in [place] lab and due to time since last checked pt will need LE Arterial Segmental Multi ordered.  I placed call to pt and left message to call back.

## 2014-12-02 NOTE — Patient Instructions (Signed)
Medication Instructions:  Your physician recommends that you continue on your current medications as directed. Please refer to the Current Medication list given to you today.   Labwork: Your physician recommends that you return for lab work on February 03, 2015.  These are fasting labs.  The lab opens at 7:30 AM   Testing/Procedures: none  Follow-Up: Your physician wants you to follow-up in: 12 months.  You will receive a reminder letter in the mail two months in advance. If you don't receive a letter, please call our office to schedule the follow-up appointment.   Any Other Special Instructions Will Be Listed Below (If Applicable).     If you need a refill on your cardiac medications before your next appointment, please call your pharmacy.

## 2014-12-02 NOTE — Telephone Encounter (Signed)
Pt returned the call.

## 2014-12-02 NOTE — Telephone Encounter (Signed)
I spoke with pt. He reports he saw foot doctor for a sore on his foot and doctor recommended pt proceed with vascular study.  I told pt I would have schedulers contact him to arrange appt.  Pt made aware studies are done at [facility] office.

## 2014-12-02 NOTE — Progress Notes (Signed)
Chief Complaint  Patient presents with  . Coronary Artery Disease      History of Present Illness: 64 yo WM with history of CAD, HLD and PAD here today for cardiac follow up. He has been followed in the past by Dr. Olevia Perches. 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 His ejection fraction was 60% that time. He is a former smoker. He has not smoked since 2009. He was admitted 11/6-11/8/13 to [facility] in [place] with an acute inferolateral STEMI secondary to an occluded left Circumflex treated with a 3.0 x22 Resolute Integrity DES post-dilated to 3.5 mm. His EF was 50% with mild lateral HK. RCA stent patent with mild restenosis. LAD with mild 25% plaque mid. Echo November 2013 with LVEF=55-60%. Exercise stress January 2016 without ischemia. He is known to have PAD with last ABI in 2013 demonstrating left SFA stenosis. (right ABI 0.91, left ABI 0.81).   He is here today for follow up. He has no chest pain or SOB, near syncope or syncope. He is very active. He does have chronic back pain. He has no claudication.   Primary Care Physician: Dr. Nancy Fetter   Past Medical History  Diagnosis Date  . HYPERLIPIDEMIA-MIXED   . CAD, NATIVE VESSEL     a. LHC in the setting of inferior STEMI in 10/09 demonstrating a mid RCA 99%, proximal LAD 70% and an EF of 60%. He underwent placement of a BMS to the RCA at that time;  b. Inf-Lat [id number] [facility]): s/p 3x22 Resolute DES to CFX, EF 50% with mild lat HK  . PAD (peripheral artery disease) ([place])   . Nephrolithiasis   . Hx of echocardiogram     a. Echo 11/08/11: EF 55-60%, normal wall motion, normal diastolic function.     Past Surgical History  Procedure Laterality Date  . Lower back surgery      Current Outpatient Prescriptions  Medication Sig Dispense Refill  . aspirin 81 MG tablet Take 81 mg by mouth daily.      Marland Kitchen atorvastatin (LIPITOR) 40 MG  tablet Take 1 tablet (40 mg total) by mouth daily. 90 tablet 3  . clopidogrel (PLAVIX) 75 MG tablet Take 1 tablet (75 mg total) by mouth daily. 90 tablet 3  . enalapril (VASOTEC) 2.5 MG tablet Take 1 tablet (2.5 mg total) by mouth daily. 90 tablet 3  . metoprolol tartrate (LOPRESSOR) 25 MG tablet Take 1 tablet (25 mg total) by mouth 2 (two) times daily. 180 tablet 3  . nitroGLYCERIN (NITROSTAT) 0.4 MG SL tablet Place 1 tablet (0.4 mg total) under the tongue every 5 (five) minutes as needed for chest pain. 25 tablet 6   No current facility-administered medications for this visit.    No Known Allergies  Social History   Social History  . Marital Status: Married    Spouse Name: N/A  . Number of Children: N/A  . Years of Education: N/A   Occupational History  . Not on file.   Social History Main Topics  . Smoking status: Former Smoker -- 2.00 packs/day for 45 years    Types: Cigarettes    Quit date: 10/24/2007  . Smokeless tobacco: Not on file  . Alcohol Use: Not on file  . Drug Use: Not on file  . Sexual Activity: Not on file   Other Topics Concern  . Not on file   Social History  Narrative    Family History  Problem Relation Age of Onset  . Coronary artery disease    . Heart attack Mother   . Heart attack Father   . Heart disease Sister   . Heart attack Sister     Review of Systems:  As stated in the HPI and otherwise negative.   BP 120/60 mmHg  Pulse 51  Ht 5\' 8"  (1.727 m)  Wt 189 lb 12.8 oz (86.093 kg)  BMI 28.87 kg/m2  Physical Examination: General: Well developed, well nourished, NAD HEENT: OP clear, mucus membranes moist SKIN: warm, dry. No rashes. Neuro: No focal deficits Musculoskeletal: Muscle strength 5/5 all ext Psychiatric: Mood and affect normal Neck: No JVD, no carotid bruits, no thyromegaly, no lymphadenopathy. Lungs:Clear bilaterally, no wheezes, rhonci, crackles Cardiovascular: Regular rate and rhythm. No murmurs, gallops or  rubs. Abdomen:Soft. Bowel sounds present. Non-tender.  Extremities: No lower extremity edema. Pulses are 2 + in the right PT and 1+ in the left PT.   EKG:  EKG is ordered today. The ekg ordered today demonstrates Sinus brady, rate 51 bpm.   Recent Labs: 02/01/2014: ALT 21   Lipid Panel    Component Value Date/Time   CHOL 141 02/01/2014 0913   TRIG 74.0 02/01/2014 0913   HDL 41.40 02/01/2014 0913   CHOLHDL 3 02/01/2014 0913   VLDL 14.8 02/01/2014 0913   LDLCALC 85 02/01/2014 0913     Wt Readings from Last 3 Encounters:  12/02/14 189 lb 12.8 oz (86.093 kg)  11/09/13 186 lb 12.8 oz (84.732 kg)  12/22/12 186 lb (84.369 kg)     Other studies Reviewed: Additional studies/ records that were reviewed today include: . Review of the above records demonstrates:   Assessment and Plan:   1. CAD, NATIVE VESSEL: Stable. Stress test January 2016 with no ischemia. Will continue ASA and Plavix. Continue statin/beta blocker/Ace-inh. Check BME January 2017.   2. HYPERLIPIDEMIA: Continue statin. Lipids and LFTs need repeating January 2017.   3. PAD: ABI October 2013 stable, 0.91 right and 0.81 left. We discussed repeating now but he does not wish to do so since his legs feel great. He has palpable bilateral PT pulses on exam.   4. Tobacco abuse, in remission: He has not started back smoking  Current medicines are reviewed at length with the patient today.  The patient does not have concerns regarding medicines.  The following changes have been made:  no change  Labs/ tests ordered today include:   Orders Placed This Encounter  Procedures  . Lipid Profile  . Hepatic function panel  . Basic Metabolic Panel (BMET)  . EKG 12-Lead    Disposition:   FU with me in 12  months  Signed, Lauree Chandler, MD 12/02/2014 8:39 AM    [facility] [address], [place], [place]  [address] Phone: [phone]; Fax: [phone]

## 2014-12-03 NOTE — Telephone Encounter (Signed)
Agree. cdm 

## 2014-12-13 ENCOUNTER — Ambulatory Visit (HOSPITAL_COMMUNITY)
Admission: RE | Admit: 2014-12-13 | Discharge: 2014-12-13 | Disposition: A | Discharge status: Home / Self Care | Payer: Managed Care, Other (non HMO) | Source: Ambulatory Visit | Attending: Cardiology | Admitting: Cardiology

## 2014-12-13 DIAGNOSIS — E782 Mixed hyperlipidemia: Secondary | ICD-10-CM | POA: Insufficient documentation

## 2014-12-13 DIAGNOSIS — I739 Peripheral vascular disease, unspecified: Secondary | ICD-10-CM

## 2015-02-03 ENCOUNTER — Other Ambulatory Visit (INDEPENDENT_AMBULATORY_CARE_PROVIDER_SITE_OTHER): Payer: Managed Care, Other (non HMO) | Admitting: *Deleted

## 2015-02-03 DIAGNOSIS — I2510 Atherosclerotic heart disease of native coronary artery without angina pectoris: Secondary | ICD-10-CM | POA: Diagnosis not present

## 2015-02-03 DIAGNOSIS — E7800 Pure hypercholesterolemia, unspecified: Secondary | ICD-10-CM

## 2015-02-03 LAB — HEPATIC FUNCTION PANEL
ALBUMIN: 4.2 g/dL (ref 3.6–5.1)
ALK PHOS: 72 U/L (ref 40–115)
ALT: 19 U/L (ref 9–46)
AST: 18 U/L (ref 10–35)
BILIRUBIN INDIRECT: 0.6 mg/dL (ref 0.2–1.2)
BILIRUBIN TOTAL: 0.8 mg/dL (ref 0.2–1.2)
Bilirubin, Direct: 0.2 mg/dL (ref <=0.2)
Total Protein: 6.9 g/dL (ref 6.1–8.1)

## 2015-02-03 LAB — LIPID PANEL
CHOL/HDL RATIO: 3.7 ratio (ref <=5.0)
CHOLESTEROL: 137 mg/dL (ref 125–200)
HDL: 37 mg/dL — ABNORMAL LOW (ref >=40)
LDL Cholesterol: 79 mg/dL (ref <130)
Triglycerides: 103 mg/dL (ref <150)
VLDL: 21 mg/dL (ref <30)

## 2015-02-03 LAB — BASIC METABOLIC PANEL
BUN: 22 mg/dL (ref 7–25)
CALCIUM: 9.4 mg/dL (ref 8.6–10.3)
CHLORIDE: 103 mmol/L (ref 98–110)
CO2: 26 mmol/L (ref 20–31)
CREATININE: 0.93 mg/dL (ref 0.70–1.25)
GLUCOSE: 110 mg/dL — AB (ref 65–99)
Potassium: 4.3 mmol/L (ref 3.5–5.3)
Sodium: 139 mmol/L (ref 135–146)

## 2015-02-03 NOTE — Addendum Note (Signed)
Addended by: Eulis Foster on: 02/03/2015 07:41 AM   Modules accepted: Orders

## 2015-11-18 ENCOUNTER — Telehealth: Payer: Self-pay | Admitting: Cardiovascular Disease

## 2015-11-18 DIAGNOSIS — I25709 Atherosclerosis of coronary artery bypass graft(s), unspecified, with unspecified angina pectoris: Secondary | ICD-10-CM

## 2015-11-18 DIAGNOSIS — E785 Hyperlipidemia, unspecified: Secondary | ICD-10-CM

## 2015-11-18 NOTE — Telephone Encounter (Signed)
We can arrange a CMET and lipids. Thanks, chris

## 2015-11-18 NOTE — Telephone Encounter (Signed)
Mr.Penniger is wanting to have labs done the same day as his appointment on 02/04/2016 at 9:15am . He needs a lab order

## 2015-11-18 NOTE — Telephone Encounter (Signed)
Called, spoke with pt. Informed pt I was able to get the lab orders (CMET/lipid) from Dr. Angelena Form for upcoming appt on 02/04/16. Informed fasting labs. Pt verbalized understanding.

## 2015-11-24 ENCOUNTER — Ambulatory Visit
Admission: RE | Admit: 2015-11-24 | Discharge: 2015-11-24 | Disposition: A | Discharge status: Home / Self Care | Payer: Managed Care, Other (non HMO) | Source: Ambulatory Visit | Attending: Family Medicine | Admitting: Family Medicine

## 2015-11-24 ENCOUNTER — Other Ambulatory Visit: Payer: Self-pay | Admitting: Family Medicine

## 2015-11-24 DIAGNOSIS — R52 Pain, unspecified: Secondary | ICD-10-CM

## 2016-02-03 NOTE — Progress Notes (Signed)
Chief Complaint  Patient presents with  . Pure Hypercholesterolemia    12 month follow up      History of Present Illness: 66 yo WM with history of CAD, HLD and PAD here today for cardiac follow up.  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 His ejection fraction was 60% that time. He is a former smoker. He has not smoked since 2009. He was admitted 11/6-11/8/13 to [facility] in [place] with an acute inferolateral STEMI secondary to an occluded left Circumflex treated with a 3.0 x22 Resolute Integrity DES. His EF was 50%. RCA stent patent with mild restenosis. LAD with mild 25% plaque mid. Echo November 2013 with LVEF=55-60%. Exercise stress January 2016 without ischemia. He is known to have PAD with last ABI in December 2016 with bilateral ABI 0.93.    He is here today for follow up. He has no chest pain or SOB, near syncope or syncope. He is very active. He does have chronic back pain. He has no claudication. He has some cramps in his legs at night or when climbing a ladder. No rest pain or ulcerations.   Primary Care Physician: Lynne Logan, MD  Past Medical History:  Diagnosis Date  . CAD, NATIVE VESSEL    a. LHC in the setting of inferior STEMI in 10/09 demonstrating a mid RCA 99%, proximal LAD 70% and an EF of 60%. He underwent placement of a BMS to the RCA at that time;  b. Inf-Lat [id number] [facility]): s/p 3x22 Resolute DES to CFX, EF 50% with mild lat HK  . Hx of echocardiogram    a. Echo 11/08/11: EF 55-60%, normal wall motion, normal diastolic function.   Marland Kitchen HYPERLIPIDEMIA-MIXED   . Nephrolithiasis   . PAD (peripheral artery disease) ([place])     Past Surgical History:  Procedure Laterality Date  . Lower Back Surgery      Current Outpatient Prescriptions  Medication Sig Dispense Refill  . aspirin 81 MG tablet Take 81 mg by mouth daily.      Marland Kitchen atorvastatin (LIPITOR) 40  MG tablet Take 1 tablet (40 mg total) by mouth daily. 90 tablet 3  . clopidogrel (PLAVIX) 75 MG tablet Take 1 tablet (75 mg total) by mouth daily. 90 tablet 3  . enalapril (VASOTEC) 2.5 MG tablet Take 1 tablet (2.5 mg total) by mouth daily. 90 tablet 3  . metoprolol tartrate (LOPRESSOR) 25 MG tablet Take 1 tablet (25 mg total) by mouth 2 (two) times daily. 180 tablet 3  . nitroGLYCERIN (NITROSTAT) 0.4 MG SL tablet Place 1 tablet (0.4 mg total) under the tongue every 5 (five) minutes as needed for chest pain. 25 tablet 6   No current facility-administered medications for this visit.     No Known Allergies  Social History   Social History  . Marital status: Married    Spouse name: N/A  . Number of children: N/A  . Years of education: N/A   Occupational History  . Not on file.   Social History Main Topics  . Smoking status: Former Smoker    Packs/day: 2.00    Years: 45.00    Types: Cigarettes    Quit date: 10/24/2007  . Smokeless tobacco: Never Used  . Alcohol use Not on file  . Drug use: Unknown  . Sexual activity: Not on file   Other Topics Concern  . Not on file  Social History Narrative  . No narrative on file    Family History  Problem Relation Age of Onset  . Coronary artery disease    . Heart attack Mother   . Heart attack Father   . Heart disease Sister   . Heart attack Sister     Review of Systems:  As stated in the HPI and otherwise negative.   BP 132/78   Pulse (!) 52   Ht 5\' 8"  (1.727 m)   Wt 193 lb 9.6 oz (87.8 kg)   BMI 29.44 kg/m   Physical Examination: General: Well developed, well nourished, NAD  HEENT: OP clear, mucus membranes moist  SKIN: warm, dry. No rashes. Neuro: No focal deficits  Musculoskeletal: Muscle strength 5/5 all ext  Psychiatric: Mood and affect normal  Neck: No JVD, no carotid bruits, no thyromegaly, no lymphadenopathy.  Lungs:Clear bilaterally, no wheezes, rhonci, crackles Cardiovascular: Regular rate and rhythm. No  murmurs, gallops or rubs. Abdomen:Soft. Bowel sounds present. Non-tender.  Extremities: No lower extremity edema. Pulses are 2 + in the right PT and 1+ in the left PT.   EKG:  EKG is ordered today. The ekg ordered today demonstrates Sinus brady, rate 52 bpm. Non-specific ST and T wave abnormality.   Recent Labs: No results found for requested labs within last 8760 hours.   Lipid Panel    Component Value Date/Time   CHOL 137 02/03/2015 0752   TRIG 103 02/03/2015 0752   HDL 37 (L) 02/03/2015 0752   CHOLHDL 3.7 02/03/2015 0752   VLDL 21 02/03/2015 0752   LDLCALC 79 02/03/2015 0752     Wt Readings from Last 3 Encounters:  02/04/16 193 lb 9.6 oz (87.8 kg)  12/02/14 189 lb 12.8 oz (86.1 kg)  11/09/13 186 lb 12.8 oz (84.7 kg)     Other studies Reviewed: Additional studies/ records that were reviewed today include: . Review of the above records demonstrates:   Assessment and Plan:   1. CAD without angina: No recent chest pain suggestive of angina. Stress test January 2016 with no ischemia. Will continue ASA and Plavix, statin/beta blocker/Ace-inh. Check BMET  2. HYPERLIPIDEMIA: Continue statin. Lipids and LFTs need repeating now.    3. PAD: ABI December 2016 stable, 0.93 bilaterally. We discussed repeating now but he does not wish to do so since his legs feel great.   4. Tobacco abuse, in remission: He has not started back smoking. We discussed the second hand smoke he is getting from his wife who smokes. She is trying to stop  Current medicines are reviewed at length with the patient today.  The patient does not have concerns regarding medicines.  The following changes have been made:  no change  Labs/ tests ordered today include:   Orders Placed This Encounter  Procedures  . EKG 12-Lead    Disposition:   FU with me in 12  months  Signed, Lauree Chandler, MD 02/04/2016 9:35 AM    [facility] [place] [place], [place], [place]  [address] Phone:  [phone]; Fax: [phone]

## 2016-02-04 ENCOUNTER — Encounter: Payer: Self-pay | Admitting: Cardiovascular Disease

## 2016-02-04 ENCOUNTER — Ambulatory Visit (INDEPENDENT_AMBULATORY_CARE_PROVIDER_SITE_OTHER): Payer: 59 | Admitting: Cardiovascular Disease

## 2016-02-04 ENCOUNTER — Other Ambulatory Visit: Payer: Self-pay | Admitting: *Deleted

## 2016-02-04 VITALS — BP 132/78 | HR 52 | Ht 68.0 in | Wt 193.6 lb

## 2016-02-04 DIAGNOSIS — E7800 Pure hypercholesterolemia, unspecified: Secondary | ICD-10-CM

## 2016-02-04 DIAGNOSIS — I739 Peripheral vascular disease, unspecified: Secondary | ICD-10-CM

## 2016-02-04 DIAGNOSIS — F17201 Nicotine dependence, unspecified, in remission: Secondary | ICD-10-CM

## 2016-02-04 DIAGNOSIS — I2510 Atherosclerotic heart disease of native coronary artery without angina pectoris: Secondary | ICD-10-CM | POA: Diagnosis not present

## 2016-02-04 LAB — LIPID PANEL
CHOLESTEROL TOTAL: 157 mg/dL (ref 100–199)
Chol/HDL Ratio: 3.8 ratio units (ref 0.0–5.0)
HDL: 41 mg/dL (ref >39)
LDL Calculated: 91 mg/dL (ref 0–99)
TRIGLYCERIDES: 124 mg/dL (ref 0–149)
VLDL CHOLESTEROL CAL: 25 mg/dL (ref 5–40)

## 2016-02-04 MED ORDER — CLOPIDOGREL BISULFATE 75 MG PO TABS: 75.0000 mg | ORAL_TABLET | Freq: Every day | ORAL | 3 refills | Status: DC

## 2016-02-04 MED ORDER — ENALAPRIL MALEATE 2.5 MG PO TABS: 2.5000 mg | ORAL_TABLET | Freq: Every day | ORAL | 3 refills | Status: DC

## 2016-02-04 MED ORDER — NITROGLYCERIN 0.4 MG SL SUBL: 0.4000 mg | SUBLINGUAL_TABLET | SUBLINGUAL | 6 refills | Status: DC | PRN

## 2016-02-04 MED ORDER — METOPROLOL TARTRATE 25 MG PO TABS: 25.0000 mg | ORAL_TABLET | Freq: Two times a day (BID) | ORAL | 3 refills | Status: DC

## 2016-02-04 MED ORDER — ATORVASTATIN CALCIUM 40 MG PO TABS: 40.0000 mg | ORAL_TABLET | Freq: Every day | ORAL | 3 refills | Status: DC

## 2016-02-04 NOTE — Patient Instructions (Signed)
Medication Instructions:  Your physician recommends that you continue on your current medications as directed. Please refer to the Current Medication list given to you today.   Labwork: Lab work to be done today--CMET, lipid profile  Testing/Procedures: none  Follow-Up: Your physician recommends that you schedule a follow-up appointment in: 12 months. Please call our office in about 9 months to schedule this appointment.    Any Other Special Instructions Will Be Listed Below (If Applicable).     If you need a refill on your cardiac medications before your next appointment, please call your pharmacy.   

## 2016-02-05 LAB — COMPREHENSIVE METABOLIC PANEL

## 2016-02-06 ENCOUNTER — Other Ambulatory Visit: Payer: Self-pay | Admitting: *Deleted

## 2016-02-06 DIAGNOSIS — E7849 Other hyperlipidemia: Secondary | ICD-10-CM

## 2016-02-06 LAB — COMPREHENSIVE METABOLIC PANEL
ALT: 20 IU/L (ref 0–44)
AST: 19 IU/L (ref 0–40)
Albumin/Globulin Ratio: 1.8 (ref 1.2–2.2)
Albumin: 4.7 g/dL (ref 3.6–4.8)
Alkaline Phosphatase: 93 IU/L (ref 39–117)
BUN/Creatinine Ratio: 21 (ref 10–24)
BUN: 20 mg/dL (ref 8–27)
Bilirubin Total: 0.6 mg/dL (ref 0.0–1.2)
CALCIUM: 9.8 mg/dL (ref 8.6–10.2)
CO2: 19 mmol/L (ref 18–29)
CREATININE: 0.96 mg/dL (ref 0.76–1.27)
Chloride: 101 mmol/L (ref 96–106)
GFR calc Af Amer: 97 mL/min/{1.73_m2} (ref >59)
GFR, EST NON AFRICAN AMERICAN: 84 mL/min/{1.73_m2} (ref >59)
GLOBULIN, TOTAL: 2.6 g/dL (ref 1.5–4.5)
GLUCOSE: 107 mg/dL — AB (ref 65–99)
Potassium: 4.9 mmol/L (ref 3.5–5.2)
Sodium: 141 mmol/L (ref 134–144)
Total Protein: 7.3 g/dL (ref 6.0–8.5)

## 2016-02-06 LAB — SPECIMEN STATUS REPORT

## 2016-02-06 MED ORDER — ATORVASTATIN CALCIUM 80 MG PO TABS: 80.0000 mg | ORAL_TABLET | Freq: Every day | ORAL | 3 refills | Status: DC

## 2016-05-03 ENCOUNTER — Other Ambulatory Visit: Payer: 59 | Admitting: *Deleted

## 2016-05-03 DIAGNOSIS — E7849 Other hyperlipidemia: Secondary | ICD-10-CM

## 2016-05-03 LAB — LIPID PANEL
CHOLESTEROL TOTAL: 141 mg/dL (ref 100–199)
Chol/HDL Ratio: 3.9 ratio (ref 0.0–5.0)
HDL: 36 mg/dL — AB (ref >39)
LDL CALC: 85 mg/dL (ref 0–99)
TRIGLYCERIDES: 99 mg/dL (ref 0–149)
VLDL Cholesterol Cal: 20 mg/dL (ref 5–40)

## 2016-05-03 LAB — HEPATIC FUNCTION PANEL
ALT: 20 IU/L (ref 0–44)
AST: 19 IU/L (ref 0–40)
Albumin: 4.5 g/dL (ref 3.6–4.8)
Alkaline Phosphatase: 88 IU/L (ref 39–117)
BILIRUBIN TOTAL: 0.7 mg/dL (ref 0.0–1.2)
Bilirubin, Direct: 0.17 mg/dL (ref 0.00–0.40)
Total Protein: 6.6 g/dL (ref 6.0–8.5)

## 2016-09-17 ENCOUNTER — Telehealth: Payer: Self-pay | Admitting: Cardiovascular Disease

## 2016-09-17 NOTE — Telephone Encounter (Signed)
°  New Prob  Received Sildenafil 20 mg prescription from his PCP/ Calling to verify this medication is safe to take with his other medications. Please call.

## 2016-09-17 NOTE — Telephone Encounter (Signed)
Ok to take. chris

## 2016-09-17 NOTE — Telephone Encounter (Signed)
I spoke with pt's wife who reports primary care ordered Sildenafil for ED.  I told her it would be OK for pt to take this but he cannot use NTG within the same 24 hour period as Sildenafil.

## 2016-09-17 NOTE — Telephone Encounter (Signed)
Will send to Dr. Angelena Form to see if OK for pt to take Sildenafil.  If OK I will make pt aware not to use within 24 hours of NTG.

## 2016-10-12 ENCOUNTER — Telehealth: Payer: Self-pay

## 2016-10-12 NOTE — Telephone Encounter (Signed)
   [facility] Pre-operative Risk Assessment    Request for surgical clearance:  1. What type of surgery is being performed? colonoscopy   2. When is this surgery scheduled? 11/15/2016  3. Are there any medications that need to be held prior to surgery and how long? Patient is currently taking aspirin and plavix. Patient has a history of colon polyps. Can this patient temporarily stop the above medications before his/her procedure, or after the procedure if polyps are removed. Please list your recommendations. Please advise on if patient can hold and how long.  4. Practice name and name of physician performing surgery? [facility]. Dr. Therisa Doyne  5. What is your office phone and fax number? P: [phone]. F: [phone].  Anesthesia type (None, local, MAC, general) ?  Not specified.  Stephannie Peters 10/12/2016, 12:50 PM  _________________________________________________________________   (provider comments below)

## 2016-10-12 NOTE — Telephone Encounter (Signed)
PA or NP to address antiplatelet clearance.

## 2016-10-15 NOTE — Telephone Encounter (Signed)
    Chart reviewed as part of pre-operative protocol coverage. Because of Keith [name]'s past medical history and time since last visit, he/she will require a follow-up visit in order to better assess preoperative cardiovascular risk.  [name], [place]  10/15/2016, 5:14 PM

## 2016-10-18 NOTE — Telephone Encounter (Signed)
Scheduled pt to see Richardson Dopp, PA-C for clearance appt 11/05/16 for colonoscopy scheduled 11/15/16

## 2016-10-21 ENCOUNTER — Encounter: Payer: Self-pay | Admitting: *Deleted

## 2016-11-05 ENCOUNTER — Ambulatory Visit: Payer: 59 | Admitting: Physician Assistant

## 2017-02-12 ENCOUNTER — Other Ambulatory Visit: Payer: Self-pay | Admitting: Cardiovascular Disease

## 2017-03-04 ENCOUNTER — Ambulatory Visit: Payer: 59 | Admitting: Cardiovascular Disease

## 2017-03-10 ENCOUNTER — Other Ambulatory Visit: Payer: Self-pay | Admitting: Cardiovascular Disease

## 2017-03-10 MED ORDER — METOPROLOL TARTRATE 25 MG PO TABS: 25.0000 mg | ORAL_TABLET | Freq: Two times a day (BID) | ORAL | 0 refills | Status: DC

## 2017-03-10 MED ORDER — ATORVASTATIN CALCIUM 80 MG PO TABS: 80.0000 mg | ORAL_TABLET | Freq: Every day | ORAL | 0 refills | Status: DC

## 2017-03-10 MED ORDER — ENALAPRIL MALEATE 2.5 MG PO TABS: 2.5000 mg | ORAL_TABLET | Freq: Every day | ORAL | 0 refills | Status: DC

## 2017-03-10 MED ORDER — CLOPIDOGREL BISULFATE 75 MG PO TABS: 75.0000 mg | ORAL_TABLET | Freq: Every day | ORAL | 0 refills | Status: DC

## 2017-03-10 NOTE — Telephone Encounter (Signed)
Pt's medication was sent to pt's pharmacy as requested, enough to last until appt in May. Confirmation received.

## 2017-04-29 ENCOUNTER — Encounter: Payer: Self-pay | Admitting: Cardiovascular Disease

## 2017-05-19 ENCOUNTER — Ambulatory Visit: Payer: 59 | Admitting: Cardiovascular Disease

## 2017-06-13 ENCOUNTER — Encounter: Payer: Self-pay | Admitting: Cardiovascular Disease

## 2017-06-13 ENCOUNTER — Ambulatory Visit (INDEPENDENT_AMBULATORY_CARE_PROVIDER_SITE_OTHER): Payer: 59 | Admitting: Cardiovascular Disease

## 2017-06-13 VITALS — BP 130/82 | HR 58 | Ht 68.0 in | Wt 193.8 lb

## 2017-06-13 DIAGNOSIS — I739 Peripheral vascular disease, unspecified: Secondary | ICD-10-CM

## 2017-06-13 DIAGNOSIS — F17201 Nicotine dependence, unspecified, in remission: Secondary | ICD-10-CM

## 2017-06-13 DIAGNOSIS — I2510 Atherosclerotic heart disease of native coronary artery without angina pectoris: Secondary | ICD-10-CM | POA: Diagnosis not present

## 2017-06-13 DIAGNOSIS — E7800 Pure hypercholesterolemia, unspecified: Secondary | ICD-10-CM

## 2017-06-13 LAB — HEPATIC FUNCTION PANEL
ALT: 22 IU/L (ref 0–44)
AST: 18 IU/L (ref 0–40)
Albumin: 4.8 g/dL (ref 3.6–4.8)
Alkaline Phosphatase: 93 IU/L (ref 39–117)
BILIRUBIN TOTAL: 0.8 mg/dL (ref 0.0–1.2)
Bilirubin, Direct: 0.21 mg/dL (ref 0.00–0.40)
Total Protein: 7.2 g/dL (ref 6.0–8.5)

## 2017-06-13 LAB — LIPID PANEL
CHOL/HDL RATIO: 3.1 ratio (ref 0.0–5.0)
Cholesterol, Total: 148 mg/dL (ref 100–199)
HDL: 47 mg/dL (ref >39)
LDL CALC: 77 mg/dL (ref 0–99)
TRIGLYCERIDES: 119 mg/dL (ref 0–149)
VLDL Cholesterol Cal: 24 mg/dL (ref 5–40)

## 2017-06-13 MED ORDER — CLOPIDOGREL BISULFATE 75 MG PO TABS: 75.0000 mg | ORAL_TABLET | Freq: Every day | ORAL | 3 refills | Status: DC

## 2017-06-13 MED ORDER — METOPROLOL TARTRATE 25 MG PO TABS: 25.0000 mg | ORAL_TABLET | Freq: Two times a day (BID) | ORAL | 3 refills | Status: DC

## 2017-06-13 MED ORDER — ENALAPRIL MALEATE 2.5 MG PO TABS: 2.5000 mg | ORAL_TABLET | Freq: Every day | ORAL | 3 refills | Status: DC

## 2017-06-13 MED ORDER — ATORVASTATIN CALCIUM 80 MG PO TABS: 80.0000 mg | ORAL_TABLET | Freq: Every day | ORAL | 3 refills | Status: DC

## 2017-06-13 MED ORDER — NITROGLYCERIN 0.4 MG SL SUBL: 0.4000 mg | SUBLINGUAL_TABLET | SUBLINGUAL | 3 refills | Status: DC | PRN

## 2017-06-13 NOTE — Progress Notes (Signed)
Chief Complaint  Patient presents with  . Follow-up    CAD   History of Present Illness: 67 yo male with history of CAD, HLD and PAD here today for cardiac follow up.  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 His ejection fraction was 60% at that time. He is a former smoker. He has not smoked since 2009. He was admitted to [facility] in [place] in November 2013 with an acute inferolateral STEMI secondary to an occluded left Circumflex treated with a 3.0 x22 Resolute Integrity DES. His EF was 50%. RCA stent patent with mild restenosis. LAD with mild 25% plaque mid. Echo November 2013 with LVEF=55-60%. Exercise stress January 2016 without ischemia. He is known to have PAD with last ABI in December 2016 with bilateral ABI 0.93.    He is here today for follow up. The patient denies any chest pain, dyspnea, palpitations, lower extremity edema, orthopnea, PND, dizziness, near syncope or syncope. No claudication, rest pain in legs or LE ulcerations.   Primary Care Physician: Donald Prose, MD  Past Medical History:  Diagnosis Date  . CAD, NATIVE VESSEL    a. LHC in the setting of inferior STEMI in 10/09 demonstrating a mid RCA 99%, proximal LAD 70% and an EF of 60%. He underwent placement of a BMS to the RCA at that time;  b. Inf-Lat [id number] [facility]): s/p 3x22 Resolute DES to CFX, EF 50% with mild lat HK  . Hx of echocardiogram    a. Echo 11/08/11: EF 55-60%, normal wall motion, normal diastolic function.   Marland Kitchen HYPERLIPIDEMIA-MIXED   . Nephrolithiasis   . PAD (peripheral artery disease) ([place])     Past Surgical History:  Procedure Laterality Date  . Lower Back Surgery      Current Outpatient Medications  Medication Sig Dispense Refill  . aspirin 81 MG tablet Take 81 mg by mouth daily.      Marland Kitchen atorvastatin (LIPITOR) 80 MG tablet Take 1 tablet (80 mg total) by mouth daily. 90 tablet 3  .  clopidogrel (PLAVIX) 75 MG tablet Take 1 tablet (75 mg total) by mouth daily. 90 tablet 3  . enalapril (VASOTEC) 2.5 MG tablet Take 1 tablet (2.5 mg total) by mouth daily. 90 tablet 3  . metoprolol tartrate (LOPRESSOR) 25 MG tablet Take 1 tablet (25 mg total) by mouth 2 (two) times daily. 180 tablet 3  . nitroGLYCERIN (NITROSTAT) 0.4 MG SL tablet Place 1 tablet (0.4 mg total) under the tongue every 5 (five) minutes as needed for chest pain. 25 tablet 3   No current facility-administered medications for this visit.     No Known Allergies  Social History   Socioeconomic History  . Marital status: Married    Spouse name: Not on file  . Number of children: Not on file  . Years of education: Not on file  . Highest education level: Not on file  Occupational History  . Not on file  Social Needs  . Financial resource strain: Not on file  . Food insecurity:    Worry: Not on file    Inability: Not on file  . Transportation needs:    Medical: Not on file    Non-medical: Not on file  Tobacco Use  . Smoking status: Former Smoker    Packs/day: 2.00    Years: 45.00    Pack years: 90.00    Types:  Cigarettes    Last attempt to quit: 10/24/2007    Years since quitting: 9.6  . Smokeless tobacco: Never Used  Substance and Sexual Activity  . Alcohol use: Never    Frequency: Never  . Drug use: Never  . Sexual activity: Not on file  Lifestyle  . Physical activity:    Days per week: Not on file    Minutes per session: Not on file  . Stress: Not on file  Relationships  . Social connections:    Talks on phone: Not on file    Gets together: Not on file    Attends religious service: Not on file    Active member of club or organization: Not on file    Attends meetings of clubs or organizations: Not on file    Relationship status: Not on file  . Intimate partner violence:    Fear of current or ex partner: Not on file    Emotionally abused: Not on file    Physically abused: Not on file     Forced sexual activity: Not on file  Other Topics Concern  . Not on file  Social History Narrative  . Not on file    Family History  Problem Relation Age of Onset  . Heart attack Mother   . Heart attack Father   . Heart disease Sister   . Heart attack Sister   . Coronary artery disease Unknown     Review of Systems:  As stated in the HPI and otherwise negative.   BP 130/82   Pulse (!) 58   Ht 5\' 8"  (1.727 m)   Wt 193 lb 12.8 oz (87.9 kg)   SpO2 95%   BMI 29.47 kg/m   Physical Examination:  General: Well developed, well nourished, NAD  HEENT: OP clear, mucus membranes moist  SKIN: warm, dry. No rashes. Neuro: No focal deficits  Musculoskeletal: Muscle strength 5/5 all ext  Psychiatric: Mood and affect normal  Neck: No JVD, no carotid bruits, no thyromegaly, no lymphadenopathy.  Lungs:Clear bilaterally, no wheezes, rhonci, crackles Cardiovascular: Regular rate and rhythm. No murmurs, gallops or rubs. Abdomen:Soft. Bowel sounds present. Non-tender.  Extremities: No lower extremity edema. Pulses are 2 + in the bilateral DP/PT.  EKG:  EKG is ordered today. The ekg ordered today demonstrates Sinus brady, rate 58 bpm.   Recent Labs: No results found for requested labs within last 8760 hours.   Lipid Panel    Component Value Date/Time   CHOL 141 05/03/2016 0737   TRIG 99 05/03/2016 0737   HDL 36 (L) 05/03/2016 0737   CHOLHDL 3.9 05/03/2016 0737   CHOLHDL 3.7 02/03/2015 0752   VLDL 21 02/03/2015 0752   LDLCALC 85 05/03/2016 0737     Wt Readings from Last 3 Encounters:  06/13/17 193 lb 12.8 oz (87.9 kg)  02/04/16 193 lb 9.6 oz (87.8 kg)  12/02/14 189 lb 12.8 oz (86.1 kg)     Other studies Reviewed: Additional studies/ records that were reviewed today include: . Review of the above records demonstrates:   Assessment and Plan:   1. CAD without angina: No chest pain. Will continue ASA, Plavix, statin and beta blocker.   2. HYPERLIPIDEMIA: LDL near goal last  year. Will continue statin and repeat lipids and LFTs now.   3. PAD: ABI December 2016 stable, 0.93 bilaterally. I discussed repeating at last visit but he declined.   4. Tobacco abuse, in remission: He does not smoke. His wife smokes. We discussed this  again.   Current medicines are reviewed at length with the patient today.  The patient does not have concerns regarding medicines.  The following changes have been made:  no change  Labs/ tests ordered today include:   Orders Placed This Encounter  Procedures  . Lipid panel  . Hepatic function panel    Disposition:   FU with me in 12  months  Signed, Lauree Chandler, MD 06/13/2017 12:00 PM    [address], [place], [place]  [address] Phone: [phone]; Fax: [phone]

## 2017-06-13 NOTE — Patient Instructions (Signed)
Your physician recommends that you continue on your current medications as directed. Please refer to the Current Medication list given to you today. Your physician recommends that you return for lab work in: [place] ([place]) Your physician wants you to follow-up in: [place].  You will receive a reminder letter in the mail two months in advance. If you don't receive a letter, please call our office to schedule the follow-up appointment.

## 2017-06-14 NOTE — Addendum Note (Signed)
Addended by: Mendel Ryder on: 06/14/2017 08:59 AM   Modules accepted: Orders

## 2017-07-14 ENCOUNTER — Telehealth: Payer: Self-pay

## 2017-07-14 NOTE — Telephone Encounter (Signed)
   [facility] Pre-operative Risk Assessment    Request for surgical clearance:  1. What type of surgery is being performed? Colonoscopy   2. When is this surgery scheduled? 09/02/2017   3. What type of clearance is required (medical clearance vs. Pharmacy clearance to hold med vs. Both)? both  4. Are there any medications that need to be held prior to surgery and how long? Plavix x 5 days, not listed Aspirin  5. Practice name and name of physician performing surgery? [facility], Dr. Therisa Doyne   6. What is your office phone number? [phone]    7.   What is your office fax number? [phone]   8.   Anesthesia type (None, local, MAC, general) ? Not listed    Keith [name] 07/14/2017, 3:55 PM  _________________________________________________________________   (provider comments below)

## 2017-07-14 NOTE — Telephone Encounter (Signed)
Dr. Angelena Form, this pt is for colonoscopy and extensive CAD. Can his Plavix be held for 5 days and aspirin for colonoscopy?  Please route response back to CV DIV PREOP  Thanks

## 2017-07-15 NOTE — Telephone Encounter (Signed)
OK to hold Plavix for procedure. Thanks, chris

## 2017-07-15 NOTE — Telephone Encounter (Signed)
   Primary Cardiologist: Lauree Chandler, MD  Chart reviewed as part of pre-operative protocol coverage. Patient was contacted 07/15/2017 in reference to pre-operative risk assessment for pending surgery as outlined below.  Keith [name] was last seen on 06/13/17 by Dr. Angelena Form.  Since that day, Keith [name] has done well with no new cardiac complaints.  Therefore, based on ACC/AHA guidelines, the patient would be at acceptable risk for the planned procedure without further cardiovascular testing.   Per Dr. Angelena Form: it is OK to hold Plavix for the procedure as needed.   I will route this recommendation to the requesting party via Epic fax function and remove from pre-op pool.  Please call with questions.  Daune Perch, NP 07/15/2017, 3:16 PM

## 2017-09-02 DIAGNOSIS — K635 Polyp of colon: Secondary | ICD-10-CM | POA: Diagnosis not present

## 2017-09-02 DIAGNOSIS — K5730 Diverticulosis of large intestine without perforation or abscess without bleeding: Secondary | ICD-10-CM | POA: Diagnosis not present

## 2017-09-02 DIAGNOSIS — D126 Benign neoplasm of colon, unspecified: Secondary | ICD-10-CM | POA: Diagnosis not present

## 2017-09-02 DIAGNOSIS — Z86010 Personal history of colonic polyps: Secondary | ICD-10-CM | POA: Diagnosis not present

## 2017-11-02 DIAGNOSIS — Z23 Encounter for immunization: Secondary | ICD-10-CM | POA: Diagnosis not present

## 2017-11-02 DIAGNOSIS — E7800 Pure hypercholesterolemia, unspecified: Secondary | ICD-10-CM | POA: Diagnosis not present

## 2017-11-02 DIAGNOSIS — M2550 Pain in unspecified joint: Secondary | ICD-10-CM | POA: Diagnosis not present

## 2017-11-02 DIAGNOSIS — R7309 Other abnormal glucose: Secondary | ICD-10-CM | POA: Diagnosis not present

## 2017-11-02 DIAGNOSIS — I2510 Atherosclerotic heart disease of native coronary artery without angina pectoris: Secondary | ICD-10-CM | POA: Diagnosis not present

## 2017-11-02 DIAGNOSIS — N529 Male erectile dysfunction, unspecified: Secondary | ICD-10-CM | POA: Diagnosis not present

## 2017-11-14 DIAGNOSIS — M1990 Unspecified osteoarthritis, unspecified site: Secondary | ICD-10-CM | POA: Diagnosis not present

## 2017-11-14 DIAGNOSIS — M064 Inflammatory polyarthropathy: Secondary | ICD-10-CM | POA: Diagnosis not present

## 2017-11-14 DIAGNOSIS — M542 Cervicalgia: Secondary | ICD-10-CM | POA: Diagnosis not present

## 2017-11-14 DIAGNOSIS — M549 Dorsalgia, unspecified: Secondary | ICD-10-CM | POA: Diagnosis not present

## 2017-11-14 DIAGNOSIS — M2550 Pain in unspecified joint: Secondary | ICD-10-CM | POA: Diagnosis not present

## 2017-11-22 DIAGNOSIS — M1990 Unspecified osteoarthritis, unspecified site: Secondary | ICD-10-CM | POA: Diagnosis not present

## 2017-11-22 DIAGNOSIS — M2550 Pain in unspecified joint: Secondary | ICD-10-CM | POA: Diagnosis not present

## 2017-11-22 DIAGNOSIS — M064 Inflammatory polyarthropathy: Secondary | ICD-10-CM | POA: Diagnosis not present

## 2017-11-22 DIAGNOSIS — M542 Cervicalgia: Secondary | ICD-10-CM | POA: Diagnosis not present

## 2017-11-22 DIAGNOSIS — M1189 Other specified crystal arthropathies, multiple sites: Secondary | ICD-10-CM | POA: Diagnosis not present

## 2017-11-22 DIAGNOSIS — M549 Dorsalgia, unspecified: Secondary | ICD-10-CM | POA: Diagnosis not present

## 2017-12-20 DIAGNOSIS — M1189 Other specified crystal arthropathies, multiple sites: Secondary | ICD-10-CM | POA: Diagnosis not present

## 2017-12-20 DIAGNOSIS — M542 Cervicalgia: Secondary | ICD-10-CM | POA: Diagnosis not present

## 2017-12-20 DIAGNOSIS — M1990 Unspecified osteoarthritis, unspecified site: Secondary | ICD-10-CM | POA: Diagnosis not present

## 2017-12-20 DIAGNOSIS — M549 Dorsalgia, unspecified: Secondary | ICD-10-CM | POA: Diagnosis not present

## 2017-12-20 DIAGNOSIS — M2550 Pain in unspecified joint: Secondary | ICD-10-CM | POA: Diagnosis not present

## 2017-12-20 DIAGNOSIS — M1710 Unilateral primary osteoarthritis, unspecified knee: Secondary | ICD-10-CM | POA: Diagnosis not present

## 2017-12-20 DIAGNOSIS — M25569 Pain in unspecified knee: Secondary | ICD-10-CM | POA: Diagnosis not present

## 2017-12-20 DIAGNOSIS — M064 Inflammatory polyarthropathy: Secondary | ICD-10-CM | POA: Diagnosis not present

## 2018-01-03 DIAGNOSIS — M1189 Other specified crystal arthropathies, multiple sites: Secondary | ICD-10-CM | POA: Diagnosis not present

## 2018-01-03 DIAGNOSIS — M1990 Unspecified osteoarthritis, unspecified site: Secondary | ICD-10-CM | POA: Diagnosis not present

## 2018-01-03 DIAGNOSIS — M1710 Unilateral primary osteoarthritis, unspecified knee: Secondary | ICD-10-CM | POA: Diagnosis not present

## 2018-01-03 DIAGNOSIS — M25569 Pain in unspecified knee: Secondary | ICD-10-CM | POA: Diagnosis not present

## 2018-01-03 DIAGNOSIS — R252 Cramp and spasm: Secondary | ICD-10-CM | POA: Diagnosis not present

## 2018-01-03 DIAGNOSIS — M2550 Pain in unspecified joint: Secondary | ICD-10-CM | POA: Diagnosis not present

## 2018-01-03 DIAGNOSIS — M549 Dorsalgia, unspecified: Secondary | ICD-10-CM | POA: Diagnosis not present

## 2018-01-03 DIAGNOSIS — M064 Inflammatory polyarthropathy: Secondary | ICD-10-CM | POA: Diagnosis not present

## 2018-01-03 DIAGNOSIS — M542 Cervicalgia: Secondary | ICD-10-CM | POA: Diagnosis not present

## 2018-03-15 ENCOUNTER — Telehealth: Payer: Self-pay | Admitting: Cardiovascular Disease

## 2018-03-15 ENCOUNTER — Other Ambulatory Visit: Payer: Self-pay | Admitting: Cardiovascular Disease

## 2018-03-15 MED ORDER — ENALAPRIL MALEATE 2.5 MG PO TABS: 2.5000 mg | ORAL_TABLET | Freq: Every day | ORAL | 0 refills | Status: DC

## 2018-03-15 MED ORDER — ATORVASTATIN CALCIUM 80 MG PO TABS: 80.0000 mg | ORAL_TABLET | Freq: Every day | ORAL | 0 refills | Status: DC

## 2018-03-15 MED ORDER — CLOPIDOGREL BISULFATE 75 MG PO TABS: 75.0000 mg | ORAL_TABLET | Freq: Every day | ORAL | 0 refills | Status: DC

## 2018-03-15 MED ORDER — METOPROLOL TARTRATE 25 MG PO TABS: 25.0000 mg | ORAL_TABLET | Freq: Two times a day (BID) | ORAL | 0 refills | Status: DC

## 2018-03-15 NOTE — Telephone Encounter (Signed)
°*  STAT* If patient is at the pharmacy, call can be transferred to refill team.   1. Which medications need to be refilled? (please list name of each medication and dose if known)New Pharmacy- need new scripts for Metoprolol, Clopidogrel, Enalapril and Atorvastatin**  2. Which pharmacy/location (including street and city if local pharmacy) is medication to be sent to? [facility]  3. Do they need a 30 day or 90 day supply? 90 days supply

## 2018-03-15 NOTE — Telephone Encounter (Signed)
Pt's medications were sent to pt's pharmacy as requested. Confirmation received.  

## 2018-05-22 ENCOUNTER — Other Ambulatory Visit: Payer: Self-pay | Admitting: Cardiovascular Disease

## 2018-05-25 ENCOUNTER — Other Ambulatory Visit: Payer: Self-pay | Admitting: Cardiovascular Disease

## 2018-05-25 MED ORDER — ENALAPRIL MALEATE 2.5 MG PO TABS: 2.5000 mg | ORAL_TABLET | Freq: Every day | ORAL | 0 refills | Status: DC

## 2018-05-25 MED ORDER — ATORVASTATIN CALCIUM 80 MG PO TABS: 80.0000 mg | ORAL_TABLET | Freq: Every day | ORAL | 0 refills | Status: DC

## 2018-05-25 MED ORDER — CLOPIDOGREL BISULFATE 75 MG PO TABS: 75.0000 mg | ORAL_TABLET | Freq: Every day | ORAL | 0 refills | Status: DC

## 2018-05-25 MED ORDER — METOPROLOL TARTRATE 25 MG PO TABS: 25.0000 mg | ORAL_TABLET | Freq: Two times a day (BID) | ORAL | 0 refills | Status: DC

## 2018-05-25 NOTE — Telephone Encounter (Signed)
Pt's medications were sent to pt's pharmacy as requested. Confirmation received.  

## 2018-06-06 ENCOUNTER — Telehealth: Payer: Self-pay | Admitting: *Deleted

## 2018-06-06 NOTE — Telephone Encounter (Signed)
Video visit -June 15,2020 Consent obtained on June 2,2020 Pt does not have scales but has BP cuff.   Virtual Visit Pre-Appointment Phone Call  "(Name), I am calling you today to discuss your upcoming appointment. We are currently trying to limit exposure to the virus that causes COVID-19 by seeing patients at home rather than in the office."  1. "What is the BEST phone number to call the day of the visit?" - include this in appointment notes-- [phone]  2. "Do you have or have access to (through a family member/friend) a smartphone with video capability that we can use for your visit?" yes a. If yes - list this number in appt notes as "cell" (if different from BEST phone #) and list the appointment type as a VIDEO visit in appointment notes b. If no - list the appointment type as a PHONE visit in appointment notes  3. Confirm consent - "In the setting of the [name] Covid19 crisis, you are scheduled for a video visit with your provider on June 15,2020 at 11:30.  Just as we do with many in-office visits, in order for you to participate in this visit, we must obtain consent.  If you'd like, I can send this to your mychart (if signed up) or email for you to review.  Otherwise, I can obtain your verbal consent now.  All virtual visits are billed to your insurance company just like a normal visit would be.  By agreeing to a virtual visit, we'd like you to understand that the technology does not allow for your provider to perform an examination, and thus may limit your provider's ability to fully assess your condition. If your provider identifies any concerns that need to be evaluated in person, we will make arrangements to do so.  Finally, though the technology is pretty good, we cannot assure that it will always work on either your or our end, and in the setting of a video visit, we may have to convert it to a phone-only visit.  In either situation, we cannot ensure that we have a secure connection.   Are you willing to proceed?" STAFF: Did the patient verbally acknowledge consent to telehealth visit? Document YES/NO here: yes  4. Advise patient to be prepared - "Two hours prior to your appointment, go ahead and check your blood pressure, pulse, oxygen saturation, and your weight (if you have the equipment to check those) and write them all down. When your visit starts, your provider will ask you for this information. If you have an Apple Watch or Kardia device, please plan to have heart rate information ready on the day of your appointment. Please have a pen and paper handy nearby the day of the visit as well."  5. Give patient instructions for MyChart download to smartphone OR Doximity/Doxy.me as below if video visit (depending on what platform provider is using)  6. Inform patient they will receive a phone call 15 minutes prior to their appointment time (may be from unknown caller ID) so they should be prepared to answer    Keith [name] has been deemed a candidate for a follow-up tele-health visit to limit community exposure during the Covid-19 pandemic. I spoke with the patient via phone to ensure availability of phone/video source, confirm preferred email & phone number, and discuss instructions and expectations.  I reminded Keith [name]. Keith [name] to be prepared with any vital sign and/or heart rhythm information that could potentially be obtained via home  monitoring, at the time of his visit. I reminded Keith [name]. Keith [name] to expect a phone call prior to his visit.  Leodis Liverpool, RN 06/06/2018 3:51 PM   INSTRUCTIONS FOR DOWNLOADING THE MYCHART APP TO SMARTPHONE  - The patient must first make sure to have activated MyChart and know their login information - If Apple, go to [employer] and type in MyChart in the search bar and download the app. If Android, ask patient to go to [employer] and type in [place] in the search bar and download the app. The app is  free but as with any other app downloads, their phone may require them to verify saved payment information or Apple/Android password.  - The patient will need to then log into the app with their MyChart username and password, and select [name] as their healthcare provider to link the account. When it is time for your visit, go to the MyChart app, find appointments, and click Begin Video Visit. Be sure to Select Allow for your device to access the Microphone and Camera for your visit. You will then be connected, and your provider will be with you shortly.  **If they have any issues connecting, or need assistance please contact MyChart service desk [phone])**  **If using a computer, in order to ensure the best quality for their visit they will need to use either of the following Internet Browsers: [employer], or Google Chrome**  IF USING DOXIMITY or DOXY.ME - The patient will receive a link just prior to their visit by text.     FULL LENGTH CONSENT FOR TELE-HEALTH VISIT   I hereby voluntarily request, consent and authorize [facility] and its employed or contracted physicians, physician assistants, nurse practitioners or other licensed health care professionals (the Practitioner), to provide me with telemedicine health care services (the "Services") as deemed necessary by the treating Practitioner. I acknowledge and consent to receive the Services by the Practitioner via telemedicine. I understand that the telemedicine visit will involve communicating with the Practitioner through live audiovisual communication technology and the disclosure of certain medical information by electronic transmission. I acknowledge that I have been given the opportunity to request an in-person assessment or other available alternative prior to the telemedicine visit and am voluntarily participating in the telemedicine visit.  I understand that I have the right to withhold or withdraw my  consent to the use of telemedicine in the course of my care at any time, without affecting my right to future care or treatment, and that the Practitioner or I may terminate the telemedicine visit at any time. I understand that I have the right to inspect all information obtained and/or recorded in the course of the telemedicine visit and may receive copies of available information for a reasonable fee.  I understand that some of the potential risks of receiving the Services via telemedicine include:  Marland Kitchen Delay or interruption in medical evaluation due to technological equipment failure or disruption; . Information transmitted may not be sufficient (e.g. poor resolution of images) to allow for appropriate medical decision making by the Practitioner; and/or  . In rare instances, security protocols could fail, causing a breach of personal health information.  Furthermore, I acknowledge that it is my responsibility to provide information about my medical history, conditions and care that is complete and accurate to the best of my ability. I acknowledge that Practitioner's advice, recommendations, and/or decision may be based on factors not within their control, such as incomplete or  inaccurate data provided by me or distortions of diagnostic images or specimens that may result from electronic transmissions. I understand that the practice of medicine is not an exact science and that Practitioner makes no warranties or guarantees regarding treatment outcomes. I acknowledge that I will receive a copy of this consent concurrently upon execution via email to the email address I last provided but may also request a printed copy by calling the office of [facility].    I understand that my insurance will be billed for this visit.   I have read or had this consent read to me. . I understand the contents of this consent, which adequately explains the benefits and risks of the Services being provided via telemedicine.   . I have been provided ample opportunity to ask questions regarding this consent and the Services and have had my questions answered to my satisfaction. . I give my informed consent for the services to be provided through the use of telemedicine in my medical care  By participating in this telemedicine visit I agree to the above.

## 2018-06-19 ENCOUNTER — Other Ambulatory Visit: Payer: Self-pay

## 2018-06-19 ENCOUNTER — Telehealth (INDEPENDENT_AMBULATORY_CARE_PROVIDER_SITE_OTHER): Payer: 59 | Admitting: Cardiovascular Disease

## 2018-06-19 ENCOUNTER — Encounter: Payer: Self-pay | Admitting: Cardiovascular Disease

## 2018-06-19 VITALS — BP 149/80 | HR 76 | Ht 68.0 in | Wt 195.0 lb

## 2018-06-19 DIAGNOSIS — I2510 Atherosclerotic heart disease of native coronary artery without angina pectoris: Secondary | ICD-10-CM | POA: Diagnosis not present

## 2018-06-19 DIAGNOSIS — I739 Peripheral vascular disease, unspecified: Secondary | ICD-10-CM

## 2018-06-19 DIAGNOSIS — F17201 Nicotine dependence, unspecified, in remission: Secondary | ICD-10-CM

## 2018-06-19 DIAGNOSIS — E7800 Pure hypercholesterolemia, unspecified: Secondary | ICD-10-CM | POA: Diagnosis not present

## 2018-06-19 NOTE — Patient Instructions (Signed)
Medication Instructions:  Your physician recommends that you continue on your current medications as directed. Please refer to the Current Medication list given to you today.  If you need a refill on your cardiac medications before your next appointment, please call your pharmacy.   Lab work: Your physician recommends that you return for lab work on June 26,2020 at 7:45.  CMET and lipid profile.  This will be fasting  If you have labs (blood work) drawn today and your tests are completely normal, you will receive your results only by: Marland Kitchen MyChart Message (if you have MyChart) OR . A paper copy in the mail If you have any lab test that is abnormal or we need to change your treatment, we will call you to review the results.  Testing/Procedures: none  Follow-Up: At [facility], you and your health needs are our priority.  As part of our continuing mission to provide you with exceptional heart care, we have created designated Provider Care Teams.  These Care Teams include your primary Cardiologist (physician) and Advanced Practice Providers (APPs -  Physician Assistants and Nurse Practitioners) who all work together to provide you with the care you need, when you need it. You will need a follow up appointment in 6 months.  Please call our office 2 months in advance to schedule this appointment.  You may see Lauree Chandler, MD or one of the following Advanced Practice Providers on your designated Care Team:   [place], PA-C Melina Copa, PA-C . Ermalinda Barrios, PA-C  Any Other Special Instructions Will Be Listed Below (If Applicable).

## 2018-06-19 NOTE — Progress Notes (Signed)
Virtual Visit via Video Note   This visit type was conducted due to national recommendations for restrictions regarding the COVID-19 Pandemic (e.g. social distancing) in an effort to limit this patient's exposure and mitigate transmission in our community.  Due to his co-morbid illnesses, this patient is at least at moderate risk for complications without adequate follow up.  This format is felt to be most appropriate for this patient at this time.  All issues noted in this document were discussed and addressed.  A limited physical exam was performed with this format.  Please refer to the patient's chart for his consent to telehealth for [facility].   Date:  06/19/2018   ID:  Keith [name], Keith [name] 1950-04-04, MRN [id number]  Patient Location: Home Provider Location: Office  PCP:  Donald Prose, MD  Cardiologist:  Lauree Chandler, MD  Electrophysiologist:  None   Evaluation Performed:  Follow-Up Visit  Chief Complaint:  Follow up- CAD  History of Present Illness:    Keith [name]. Keith [name] is a 68 y.o. male with history of CAD, HLD and PAD who is being seen today by virtual e-visit due to the Covid19 pandemic. 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 His ejection fraction was 60% at that time. He is a former smoker. He has not smoked since 2009. He was admitted to [facility] in [place] in November 2013 with an acute inferolateral STEMI secondary to an occluded left Circumflex treated with a 3.0 x22 Resolute Integrity DES. His EF was 50%. RCA stent patent with mild restenosis. LAD with mild 25% plaque mid. Echo November 2013 with LVEF=55-60%. Exercise stress January 2016 without ischemia. He is known to have PAD with last ABI in December 2016 with bilateral ABI 0.93.    The patient denies chest pain, dyspnea, palpitations, dizziness, near syncope or syncope. No lower extremity edema.   The  patient does not have symptoms concerning for COVID-19 infection (fever, chills, cough, or new shortness of breath).    Past Medical History:  Diagnosis Date  . CAD, NATIVE VESSEL    a. LHC in the setting of inferior STEMI in 10/09 demonstrating a mid RCA 99%, proximal LAD 70% and an EF of 60%. He underwent placement of a BMS to the RCA at that time;  b. Inf-Lat [id number] [facility]): s/p 3x22 Resolute DES to CFX, EF 50% with mild lat HK  . Hx of echocardiogram    a. Echo 11/08/11: EF 55-60%, normal wall motion, normal diastolic function.   Marland Kitchen HYPERLIPIDEMIA-MIXED   . Nephrolithiasis   . PAD (peripheral artery disease) ([place])    Past Surgical History:  Procedure Laterality Date  . Lower Back Surgery       Current Meds  Medication Sig  . aspirin 81 MG tablet Take 81 mg by mouth daily.    Marland Kitchen atorvastatin (LIPITOR) 80 MG tablet Take 1 tablet (80 mg total) by mouth daily. Please make yearly appt with Dr. Angelena Form for June before anymore refills. 1st attempt  . clopidogrel (PLAVIX) 75 MG tablet Take 1 tablet (75 mg total) by mouth daily. Please make yearly appt with Dr. Angelena Form for June before anymore refills. 1st attempt  . enalapril (VASOTEC) 2.5 MG tablet Take 1 tablet (2.5 mg total) by mouth daily. Please make yearly appt with Dr. Angelena Form for June before anymore refills. 1st attempt  . metoprolol tartrate (LOPRESSOR) 25  MG tablet Take 1 tablet (25 mg total) by mouth 2 (two) times daily. Please make appt with Dr. Angelena Form for June before anymore refills. 1st attempt  . nitroGLYCERIN (NITROSTAT) 0.4 MG SL tablet Place 1 tablet (0.4 mg total) under the tongue every 5 (five) minutes as needed for chest pain.     Allergies:   Patient has no known allergies.   Social History   Tobacco Use  . Smoking status: Former Smoker    Packs/day: 2.00    Years: 45.00    Pack years: 90.00    Types: Cigarettes    Quit date: 10/24/2007    Years since quitting: 10.6  . Smokeless tobacco:  Never Used  Substance Use Topics  . Alcohol use: Never    Frequency: Never  . Drug use: Never     Family Hx: The patient's family history includes Coronary artery disease in his unknown relative; Heart attack in his father, mother, and sister; Heart disease in his sister.  ROS:   Please see the history of present illness.    All other systems reviewed and are negative.   Prior CV studies:   The following studies were reviewed today:    Labs/Other Tests and Data Reviewed:    EKG:  No ECG reviewed.  Recent Labs: No results found for requested labs within last 8760 hours.   Recent Lipid Panel Lab Results  Component Value Date/Time   CHOL 148 06/13/2017 11:59 AM   TRIG 119 06/13/2017 11:59 AM   HDL 47 06/13/2017 11:59 AM   CHOLHDL 3.1 06/13/2017 11:59 AM   CHOLHDL 3.7 02/03/2015 07:52 AM   LDLCALC 77 06/13/2017 11:59 AM    Wt Readings from Last 3 Encounters:  06/19/18 195 lb (88.5 kg)  06/13/17 193 lb 12.8 oz (87.9 kg)  02/04/16 193 lb 9.6 oz (87.8 kg)     Objective:    Vital Signs:  BP (!) 149/80   Pulse 76   Ht _0  (1.727 m)   Wt 195 lb (88.5 kg)   BMI 29.65 kg/m    VITAL SIGNS:  reviewed GEN:  no acute distress  ASSESSMENT & PLAN:    1. CAD without angina: He has no chest pain. Continue ASA, Plavix, beta blocker and statin.    2. HYPERLIPIDEMIA: LDL near goal in 2019. Continue statin. Repeat lipids and LFTs now.    3. PAD: He has no leg pain  4. 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    COVID-19 Education: The signs and symptoms of COVID-19 were discussed with the patient and how to seek care for testing (follow up with PCP or arrange E-visit).  The importance of social distancing was discussed today.  Time:   Today, I have spent  minutes with the patient with telehealth technology discussing the above problems.     Medication Adjustments/Labs and Tests Ordered: Current medicines are reviewed at length with the patient today.   Concerns regarding medicines are outlined above.   Tests Ordered: Orders Placed This Encounter  Procedures  . Comp Met (CMET)  . Lipid Profile    Medication Changes: No orders of the defined types were placed in this encounter.   Disposition:  Follow up in 6 month(s)  Signed, Lauree Chandler, MD  06/19/2018 10:54 AM    [facility]

## 2018-06-24 ENCOUNTER — Encounter

## 2018-06-29 ENCOUNTER — Telehealth: Payer: Self-pay | Admitting: *Deleted

## 2018-06-29 NOTE — Telephone Encounter (Signed)
    COVID-19 Pre-Screening Questions:  . In the past 7 to 10 days have you had a cough,  shortness of breath, headache, congestion, fever (100 or greater) body aches, chills, sore throat, or sudden loss of taste or sense of smell? . Have you been around anyone with known Covid 19. . Have you been around anyone who is awaiting Covid 19 test results in the past 7 to 10 days? . Have you been around anyone who has been exposed to Covid 19, or has mentioned symptoms of Covid 19 within the past 7 to 10 days?  If you have any concerns/questions about symptoms patients report during screening (either on the phone or at threshold). Contact the provider seeing the patient or DOD for further guidance.  If neither are available contact a member of the leadership team.           Contacted patient via telephone call, answered NO to all Civid 19 questions has a mask. KB 

## 2018-06-30 ENCOUNTER — Other Ambulatory Visit: Payer: Self-pay

## 2018-06-30 ENCOUNTER — Other Ambulatory Visit: Payer: Medicare Other | Admitting: *Deleted

## 2018-06-30 DIAGNOSIS — I2510 Atherosclerotic heart disease of native coronary artery without angina pectoris: Secondary | ICD-10-CM

## 2018-06-30 DIAGNOSIS — E7800 Pure hypercholesterolemia, unspecified: Secondary | ICD-10-CM

## 2018-06-30 LAB — COMPREHENSIVE METABOLIC PANEL
ALT: 23 IU/L (ref 0–44)
AST: 25 IU/L (ref 0–40)
Albumin/Globulin Ratio: 2.2 (ref 1.2–2.2)
Albumin: 4.7 g/dL (ref 3.8–4.8)
Alkaline Phosphatase: 88 IU/L (ref 39–117)
BUN/Creatinine Ratio: 16 (ref 10–24)
BUN: 16 mg/dL (ref 8–27)
Bilirubin Total: 1 mg/dL (ref 0.0–1.2)
CO2: 24 mmol/L (ref 20–29)
Calcium: 9.6 mg/dL (ref 8.6–10.2)
Chloride: 100 mmol/L (ref 96–106)
Creatinine, Ser: 1.01 mg/dL (ref 0.76–1.27)
GFR calc Af Amer: 89 mL/min/{1.73_m2} (ref >59)
GFR calc non Af Amer: 77 mL/min/{1.73_m2} (ref >59)
Globulin, Total: 2.1 g/dL (ref 1.5–4.5)
Glucose: 101 mg/dL — ABNORMAL HIGH (ref 65–99)
Potassium: 4.2 mmol/L (ref 3.5–5.2)
Sodium: 141 mmol/L (ref 134–144)
Total Protein: 6.8 g/dL (ref 6.0–8.5)

## 2018-06-30 LAB — LIPID PANEL
Chol/HDL Ratio: 3.4 ratio (ref 0.0–5.0)
Cholesterol, Total: 140 mg/dL (ref 100–199)
HDL: 41 mg/dL (ref >39)
LDL Calculated: 75 mg/dL (ref 0–99)
Triglycerides: 121 mg/dL (ref 0–149)
VLDL Cholesterol Cal: 24 mg/dL (ref 5–40)

## 2018-08-15 ENCOUNTER — Encounter (HOSPITAL_COMMUNITY): Payer: Self-pay

## 2018-08-15 ENCOUNTER — Ambulatory Visit (HOSPITAL_COMMUNITY)
Admission: EM | Admit: 2018-08-15 | Discharge: 2018-08-15 | Disposition: A | Discharge status: Home / Self Care | Payer: Medicare Other | Attending: Family Medicine | Admitting: Family Medicine

## 2018-08-15 ENCOUNTER — Other Ambulatory Visit: Payer: Self-pay

## 2018-08-15 DIAGNOSIS — R319 Hematuria, unspecified: Secondary | ICD-10-CM | POA: Diagnosis not present

## 2018-08-15 DIAGNOSIS — R109 Unspecified abdominal pain: Secondary | ICD-10-CM | POA: Diagnosis not present

## 2018-08-15 DIAGNOSIS — R030 Elevated blood-pressure reading, without diagnosis of hypertension: Secondary | ICD-10-CM

## 2018-08-15 LAB — POCT URINALYSIS DIP (DEVICE)
Bilirubin Urine: NEGATIVE
Glucose, UA: NEGATIVE mg/dL
Ketones, ur: NEGATIVE mg/dL
Leukocytes,Ua: NEGATIVE
Nitrite: NEGATIVE
Protein, ur: 30 mg/dL — AB
Specific Gravity, Urine: 1.025 (ref 1.005–1.030)
Urobilinogen, UA: 0.2 mg/dL (ref 0.0–1.0)
pH: 5 (ref 5.0–8.0)

## 2018-08-15 MED ORDER — KETOROLAC TROMETHAMINE 30 MG/ML IJ SOLN
INTRAMUSCULAR | Status: AC
  Filled 2018-08-15: qty 1

## 2018-08-15 MED ORDER — HYDROCODONE-ACETAMINOPHEN 5-325 MG PO TABS: 1.0000 | ORAL_TABLET | Freq: Four times a day (QID) | ORAL | 0 refills | Status: DC | PRN

## 2018-08-15 MED ORDER — KETOROLAC TROMETHAMINE 30 MG/ML IJ SOLN
30.0000 mg | Freq: Once | INTRAMUSCULAR | Status: AC
  Administered 2018-08-15: 30 mg via INTRAMUSCULAR

## 2018-08-15 NOTE — Discharge Instructions (Signed)
Your blood pressure was noted to be elevated during your visit today. You may return here within the next few days to recheck if unable to see your primary care doctor.  Meds ordered this encounter  Medications   ketorolac (TORADOL) 30 MG/ML injection 30 mg   HYDROcodone-acetaminophen (NORCO/VICODIN) 5-325 MG tablet    Sig: Take 1 tablet by mouth every 6 (six) hours as needed for moderate pain or severe pain.    Dispense:  8 tablet    Refill:  0

## 2018-08-15 NOTE — ED Provider Notes (Signed)
[place]   [id number] 08/15/18 Arrival Time: 6213  ASSESSMENT & PLAN:  1. Left flank pain   2. Hematuria, unspecified type   3. Elevated blood pressure reading without diagnosis of hypertension     Benign abdominal exam. No indications for urgent abdominal/pelvic imaging at this time. Symptoms consistent with his previous kidney stones that usually past within a few days. Discussed.  Meds ordered this encounter  Medications  . ketorolac (TORADOL) 30 MG/ML injection 30 mg  . HYDROcodone-acetaminophen (NORCO/VICODIN) 5-325 MG tablet    Sig: Take 1 tablet by mouth every 6 (six) hours as needed for moderate pain or severe pain.    Dispense:  8 tablet    Refill:  0    Discharge Instructions      Your blood pressure was noted to be elevated during your visit today. You may return here within the next few days to recheck if unable to see your primary care doctor.    Follow-up Information    [place].   Specialty: Emergency Medicine Why: Should your symptoms worsen. Contact information: [address] [id number] mc [address] [place]       Donald Prose, MD.   Specialty: Family Medicine Why: As needed. Contact information: [place] [address] [phone]          [place] Controlled Substances Registry consulted for this patient. I feel the risk/benefit ratio today is favorable for proceeding with this prescription for a controlled substance. Medication sedation precautions given.  Reviewed expectations re: course of current medical issues. Questions answered. Outlined signs and symptoms indicating need for more acute intervention. Patient verbalized understanding. After Visit Summary given.   SUBJECTIVE: History from: patient. Keith [name] is a 68 y.o. male who presents with complaint of intermittent left flank pain. Onset abrupt, several  hours ago. Discomfort described as colicky; with radiation toward his left groin. Symptoms are stable since beginning. Fever: absent. Aggravating factors: have not been identified. Alleviating factors: have not been identified. Associated symptoms: mild nausea without emesis. He denies constipation, diarrhea, dysuria, fever and sweats. Appetite: normal. PO intake: normal. Ambulatory without assistance. Urinary symptoms: none. Bowel movements: have not significantly changed. History of similar: yes ("just like my kidney stones I've had before"; last appox 3 y ago). OTC treatment: none reported.  Increased blood pressure noted today.  He reports no chest pain on exertion, no dyspnea on exertion, no swelling of ankles, no orthostatic dizziness or lightheadedness, no orthopnea or paroxysmal nocturnal dyspnea, no palpitations and no intermittent claudication symptoms.  Past Surgical History:  Procedure Laterality Date  . Lower Back Surgery      ROS: As per HPI. All other systems negative.  OBJECTIVE:  Vitals:   08/15/18 1819  BP: (!) 161/96  Pulse: 76  Resp: 17  Temp: (!) 97.1 F (36.2 C)  TempSrc: Oral  SpO2: 100%    General appearance: alert, oriented, no acute distress  Oropharynx: moist Lungs: clear to auscultation bilaterally; unlabored respirations Heart: regular rate and rhythm Abdomen: soft; without distention; no tenderness; normal bowel sounds; without masses or organomegaly; without guarding or rebound tenderness Back: with mild L CVA tenderness; FROM at waist Extremities: without LE edema; symmetrical; without gross deformities Skin: warm and dry Neurologic: normal gait Psychological: alert and cooperative; normal mood and affect  Labs: Results for orders placed or performed during the hospital encounter of 08/15/18  POCT urinalysis dip (device)  Result Value  Ref Range   Glucose, UA NEGATIVE NEGATIVE mg/dL   Bilirubin Urine NEGATIVE NEGATIVE   Ketones, ur NEGATIVE  NEGATIVE mg/dL   Specific Gravity, Urine 1.025 1.005 - 1.030   Hgb urine dipstick LARGE (A) NEGATIVE   pH 5.0 5.0 - 8.0   Protein, ur 30 (A) NEGATIVE mg/dL   Urobilinogen, UA 0.2 0.0 - 1.0 mg/dL   Nitrite NEGATIVE NEGATIVE   Leukocytes,Ua NEGATIVE NEGATIVE   Labs Reviewed  POCT URINALYSIS DIP (DEVICE) - Abnormal; Notable for the following components:      Result Value   Hgb urine dipstick LARGE (*)    Protein, ur 30 (*)    All other components within normal limits   No Known Allergies                                             Past Medical History:  Diagnosis Date  . CAD, NATIVE VESSEL    a. LHC in the setting of inferior STEMI in 10/09 demonstrating a mid RCA 99%, proximal LAD 70% and an EF of 60%. He underwent placement of a BMS to the RCA at that time;  b. Inf-Lat [id number] [facility]): s/p 3x22 Resolute DES to CFX, EF 50% with mild lat HK  . Hx of echocardiogram    a. Echo 11/08/11: EF 55-60%, normal wall motion, normal diastolic function.   Marland Kitchen HYPERLIPIDEMIA-MIXED   . Nephrolithiasis   . PAD (peripheral artery disease) (HCC)    Social History   Socioeconomic History  . Marital status: Married    Spouse name: Not on file  . Number of children: Not on file  . Years of education: Not on file  . Highest education level: Not on file  Occupational History  . Not on file  Social Needs  . Financial resource strain: Not on file  . Food insecurity    Worry: Not on file    Inability: Not on file  . Transportation needs    Medical: Not on file    Non-medical: Not on file  Tobacco Use  . Smoking status: Former Smoker    Packs/day: 2.00    Years: 45.00    Pack years: 90.00    Types: Cigarettes    Quit date: 10/24/2007    Years since quitting: 10.8  . Smokeless tobacco: Never Used  Substance and Sexual Activity  . Alcohol use: Never    Frequency: Never  . Drug use: Never  . Sexual activity: Not on file  Lifestyle  . Physical activity    Days per week: Not  on file    Minutes per session: Not on file  . Stress: Not on file  Relationships  . Social Herbalist on phone: Not on file    Gets together: Not on file    Attends religious service: Not on file    Active member of club or organization: Not on file    Attends meetings of clubs or organizations: Not on file    Relationship status: Not on file  . Intimate partner violence    Fear of current or ex partner: Not on file    Emotionally abused: Not on file    Physically abused: Not on file    Forced sexual activity: Not on file  Other Topics Concern  . Not on file  Social History Narrative  .  Not on file   Family History  Problem Relation Age of Onset  . Heart attack Mother   . Heart attack Father   . Heart disease Sister   . Heart attack Sister   . Coronary artery disease Other      Vanessa Kick, MD 08/16/18 [phone]

## 2018-08-15 NOTE — ED Triage Notes (Signed)
Patient presents to Urgent Care with complaints of right flank pain since this afternoon at 1pm. Patient reports he has had kidney stones in the past and this feels the same.

## 2018-08-21 ENCOUNTER — Other Ambulatory Visit: Payer: Self-pay | Admitting: Cardiovascular Disease

## 2018-11-07 DIAGNOSIS — E7800 Pure hypercholesterolemia, unspecified: Secondary | ICD-10-CM | POA: Diagnosis not present

## 2018-11-07 DIAGNOSIS — I2510 Atherosclerotic heart disease of native coronary artery without angina pectoris: Secondary | ICD-10-CM | POA: Diagnosis not present

## 2018-11-07 DIAGNOSIS — Z0000 Encounter for general adult medical examination without abnormal findings: Secondary | ICD-10-CM | POA: Diagnosis not present

## 2018-11-07 DIAGNOSIS — M2550 Pain in unspecified joint: Secondary | ICD-10-CM | POA: Diagnosis not present

## 2018-11-07 DIAGNOSIS — Z125 Encounter for screening for malignant neoplasm of prostate: Secondary | ICD-10-CM | POA: Diagnosis not present

## 2018-11-07 DIAGNOSIS — N529 Male erectile dysfunction, unspecified: Secondary | ICD-10-CM | POA: Diagnosis not present

## 2018-11-07 DIAGNOSIS — Z1211 Encounter for screening for malignant neoplasm of colon: Secondary | ICD-10-CM | POA: Diagnosis not present

## 2018-11-07 DIAGNOSIS — R7303 Prediabetes: Secondary | ICD-10-CM | POA: Diagnosis not present

## 2018-12-25 ENCOUNTER — Telehealth: Payer: Self-pay | Admitting: *Deleted

## 2018-12-25 DIAGNOSIS — R1314 Dysphagia, pharyngoesophageal phase: Secondary | ICD-10-CM | POA: Diagnosis not present

## 2018-12-25 DIAGNOSIS — Z86010 Personal history of colonic polyps: Secondary | ICD-10-CM | POA: Diagnosis not present

## 2018-12-25 NOTE — Telephone Encounter (Signed)
   [facility] Pre-operative Risk Assessment    Request for surgical clearance:  1. What type of surgery is being performed? COLONOSCOPY/ENDOSCOPY   2. When is this surgery scheduled? 02/01/19   3. What type of clearance is required (medical clearance vs. Pharmacy clearance to hold med vs. Both)? MEDICAL  4. Are there any medications that need to be held prior to surgery and how long? PLAVIX   5. Practice name and name of physician performing surgery? [facility]; DR. Therisa Doyne   6. What is your office phone number [phone]    7.   What is your office fax number [phone]  8.   Anesthesia type (None, local, MAC, general) ? PROPOFOL   Keith [name] 12/25/2018, 1:44 PM  _________________________________________________________________   (provider comments below)

## 2018-12-26 NOTE — Telephone Encounter (Signed)
   Primary Cardiologist: Lauree Chandler, MD  Chart reviewed as part of pre-operative protocol coverage. Patient was contacted 12/26/2018 in reference to pre-operative risk assessment for pending surgery as outlined below.  Keith [name] was last seen on 06/19/18 via telemedicine by Dr. Angelena Form.  Since that day, Keith [name] has done well. He is able to work around the house without any exertional symptoms. No new cardiac complaints.   Therefore, based on ACC/AHA guidelines, the patient would be at acceptable risk for the planned procedure without further cardiovascular testing.   It is OK to hold Plavix for 5 days prior to the procedure. (he did so for colonoscopy last year and did well). Please resume as soon as safe after procedure.   I will route this recommendation to the requesting party via Epic fax function and remove from pre-op pool.  Please call with questions.  Daune Perch, NP 12/26/2018, 11:18 AM

## 2019-01-28 NOTE — Progress Notes (Signed)
Chief Complaint  Patient presents with  . Follow-up    CAD   History of Present Illness: 69 yo male with history of CAD, HLD and PAD here today for cardiac follow up.  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 His ejection fraction was 60% at that time. He is a former smoker. He has not smoked since 2009. He was admitted to [facility] in [place] in November 2013 with an acute inferolateral STEMI secondary to an occluded left Circumflex treated with a 3.0 x22 Resolute Integrity DES. His EF was 50%. RCA stent patent with mild restenosis. LAD with mild 25% plaque mid. Echo November 2013 with LVEF=55-60%. Exercise stress January 2016 without ischemia. He is known to have PAD with last ABI in December 2016 with bilateral ABI 0.93.    He is here today for follow up. The patient denies any chest pain, dyspnea, palpitations, lower extremity edema, orthopnea, PND, dizziness, near syncope or syncope.   Primary Care Physician: Donald Prose, MD  Past Medical History:  Diagnosis Date  . CAD, NATIVE VESSEL    a. LHC in the setting of inferior STEMI in 10/09 demonstrating a mid RCA 99%, proximal LAD 70% and an EF of 60%. He underwent placement of a BMS to the RCA at that time;  b. Inf-Lat [id number] [facility]): s/p 3x22 Resolute DES to CFX, EF 50% with mild lat HK  . Hx of echocardiogram    a. Echo 11/08/11: EF 55-60%, normal wall motion, normal diastolic function.   Marland Kitchen HYPERLIPIDEMIA-MIXED   . Nephrolithiasis   . PAD (peripheral artery disease) ([place])     Past Surgical History:  Procedure Laterality Date  . Lower Back Surgery      Current Outpatient Medications  Medication Sig Dispense Refill  . aspirin 81 MG tablet Take 81 mg by mouth daily.      Marland Kitchen atorvastatin (LIPITOR) 80 MG tablet Take 1 tablet (80 mg total) by mouth daily at 6 PM. 90 tablet 1  . clopidogrel (PLAVIX) 75 MG tablet Take 1  tablet (75 mg total) by mouth daily. 90 tablet 1  . enalapril (VASOTEC) 2.5 MG tablet Take 1 tablet (2.5 mg total) by mouth daily. 90 tablet 1  . HYDROcodone-acetaminophen (NORCO/VICODIN) 5-325 MG tablet Take 1 tablet by mouth every 6 (six) hours as needed for moderate pain or severe pain. 8 tablet 0  . metoprolol tartrate (LOPRESSOR) 25 MG tablet Take 1 tablet (25 mg total) by mouth 2 (two) times daily. 180 tablet 1  . nitroGLYCERIN (NITROSTAT) 0.4 MG SL tablet Place 1 tablet (0.4 mg total) under the tongue every 5 (five) minutes as needed for chest pain. 25 tablet 3   No current facility-administered medications for this visit.    No Known Allergies  Social History   Socioeconomic History  . Marital status: Married    Spouse name: Not on file  . Number of children: Not on file  . Years of education: Not on file  . Highest education level: Not on file  Occupational History  . Not on file  Tobacco Use  . Smoking status: Former Smoker    Packs/day: 2.00    Years: 45.00    Pack years: 90.00    Types: Cigarettes    Quit date: 10/24/2007    Years since quitting: 11.2  . Smokeless tobacco: Never Used  Substance and Sexual Activity  .  Alcohol use: Never  . Drug use: Never  . Sexual activity: Not on file  Other Topics Concern  . Not on file  Social History Narrative  . Not on file   Social Determinants of Health   Financial Resource Strain:   . Difficulty of Paying Living Expenses: Not on file  Food Insecurity:   . Worried About Charity fundraiser in the Last Year: Not on file  . Ran Out of Food in the Last Year: Not on file  Transportation Needs:   . Lack of Transportation (Medical): Not on file  . Lack of Transportation (Non-Medical): Not on file  Physical Activity:   . Days of Exercise per Week: Not on file  . Minutes of Exercise per Session: Not on file  Stress:   . Feeling of Stress : Not on file  Social Connections:   . Frequency of Communication with Friends  and Family: Not on file  . Frequency of Social Gatherings with Friends and Family: Not on file  . Attends Religious Services: Not on file  . Active Member of Clubs or Organizations: Not on file  . Attends Archivist Meetings: Not on file  . Marital Status: Not on file  Intimate Partner Violence:   . Fear of Current or Ex-Partner: Not on file  . Emotionally Abused: Not on file  . Physically Abused: Not on file  . Sexually Abused: Not on file    Family History  Problem Relation Age of Onset  . Heart attack Mother   . Heart attack Father   . Heart disease Sister   . Heart attack Sister   . Coronary artery disease Other     Review of Systems:  As stated in the HPI and otherwise negative.   BP (!) 142/86   Pulse (!) 56   Ht 5\' 8"  (1.727 m)   Wt 202 lb (91.6 kg)   SpO2 97%   BMI 30.71 kg/m   Physical Examination:  General: Well developed, well nourished, NAD  HEENT: OP clear, mucus membranes moist  SKIN: warm, dry. No rashes. Neuro: No focal deficits  Musculoskeletal: Muscle strength 5/5 all ext  Psychiatric: Mood and affect normal  Neck: No JVD, no carotid bruits, no thyromegaly, no lymphadenopathy.  Lungs:Clear bilaterally, no wheezes, rhonci, crackles Cardiovascular: Regular rate and rhythm. No murmurs, gallops or rubs. Abdomen:Soft. Bowel sounds present. Non-tender.  Extremities: No lower extremity edema. Pulses are 2 + in the bilateral DP/PT.  EKG:  EKG is ordered today. The ekg ordered today demonstrates Sinus bradycardia, rate 56 bpm  Recent Labs: 06/30/2018: ALT 23; BUN 16; Creatinine, Ser 1.01; Potassium 4.2; Sodium 141   Lipid Panel    Component Value Date/Time   CHOL 140 06/30/2018 0736   TRIG 121 06/30/2018 0736   HDL 41 06/30/2018 0736   CHOLHDL 3.4 06/30/2018 0736   CHOLHDL 3.7 02/03/2015 0752   VLDL 21 02/03/2015 0752   LDLCALC 75 06/30/2018 0736     Wt Readings from Last 3 Encounters:  01/29/19 202 lb (91.6 kg)  06/19/18 195 lb  (88.5 kg)  06/13/17 193 lb 12.8 oz (87.9 kg)     Other studies Reviewed: Additional studies/ records that were reviewed today include: . Review of the above records demonstrates:   Assessment and Plan:   1. CAD without angina: No chest pain suggestive of angina. Continue ASA, Plavix, beta blocker and statin.    2. HYPERLIPIDEMIA: LDL near goal November 2020. (LDL 83). Continue statin.  We discussed Zetia but he wishes to work on his diet first before adding Zetia.   3. PAD: ABI December 2016 stable, 0.93 bilaterally. He has had no symptoms c/w claudication. We discussed repeating his ABI but he does not wish to do this today.   4. Tobacco abuse, in remission: He no longer smokes  Current medicines are reviewed at length with the patient today.  The patient does not have concerns regarding medicines.  The following changes have been made:  no change  Labs/ tests ordered today include:   Orders Placed This Encounter  Procedures  . EKG 12-Lead    Disposition:   FU with me in 12  months  Signed, Lauree Chandler, MD 01/29/2019 8:49 AM    [facility] [place], [place], [place]  [id number] Phone: [phone]; Fax: [phone]

## 2019-01-29 ENCOUNTER — Other Ambulatory Visit: Payer: Self-pay

## 2019-01-29 ENCOUNTER — Encounter: Payer: Self-pay | Admitting: Cardiovascular Disease

## 2019-01-29 ENCOUNTER — Ambulatory Visit (INDEPENDENT_AMBULATORY_CARE_PROVIDER_SITE_OTHER): Payer: 59 | Admitting: Cardiovascular Disease

## 2019-01-29 VITALS — BP 142/86 | HR 56 | Ht 68.0 in | Wt 202.0 lb

## 2019-01-29 DIAGNOSIS — I739 Peripheral vascular disease, unspecified: Secondary | ICD-10-CM | POA: Diagnosis not present

## 2019-01-29 DIAGNOSIS — E7800 Pure hypercholesterolemia, unspecified: Secondary | ICD-10-CM

## 2019-01-29 DIAGNOSIS — I2510 Atherosclerotic heart disease of native coronary artery without angina pectoris: Secondary | ICD-10-CM | POA: Diagnosis not present

## 2019-01-29 NOTE — Patient Instructions (Signed)
Medication Instructions:  Your physician recommends that you continue on your current medications as directed. Please refer to the Current Medication list given to you today.  *If you need a refill on your cardiac medications before your next appointment, please call your pharmacy*  Lab Work: None ordered  If you have labs (blood work) drawn today and your tests are completely normal, you will receive your results only by: Marland Kitchen MyChart Message (if you have MyChart) OR . A paper copy in the mail If you have any lab test that is abnormal or we need to change your treatment, we will call you to review the results.  Testing/Procedures: None ordered  Follow-Up: At [facility], you and your health needs are our priority.  As part of our continuing mission to provide you with exceptional heart care, we have created designated Provider Care Teams.  These Care Teams include your primary Cardiologist (physician) and Advanced Practice Providers (APPs -  Physician Assistants and Nurse Practitioners) who all work together to provide you with the care you need, when you need it.  Your next appointment:   12 month(s)  The format for your next appointment:   In Person  Provider:   You may see Lauree Chandler, MD or one of the following Advanced Practice Providers on your designated Care Team:    Melina Copa, PA-C  Ermalinda Barrios, PA-C   Other Instructions

## 2019-02-05 ENCOUNTER — Ambulatory Visit: Payer: 59 | Admitting: Cardiovascular Disease

## 2019-03-05 DIAGNOSIS — R0681 Apnea, not elsewhere classified: Secondary | ICD-10-CM | POA: Diagnosis not present

## 2019-03-05 DIAGNOSIS — I739 Peripheral vascular disease, unspecified: Secondary | ICD-10-CM | POA: Diagnosis not present

## 2019-03-05 DIAGNOSIS — I252 Old myocardial infarction: Secondary | ICD-10-CM | POA: Diagnosis not present

## 2019-03-05 DIAGNOSIS — R7303 Prediabetes: Secondary | ICD-10-CM | POA: Diagnosis not present

## 2019-03-05 DIAGNOSIS — R030 Elevated blood-pressure reading, without diagnosis of hypertension: Secondary | ICD-10-CM | POA: Diagnosis not present

## 2019-03-05 DIAGNOSIS — E785 Hyperlipidemia, unspecified: Secondary | ICD-10-CM | POA: Diagnosis not present

## 2019-03-05 DIAGNOSIS — G4719 Other hypersomnia: Secondary | ICD-10-CM | POA: Diagnosis not present

## 2019-03-05 DIAGNOSIS — I2510 Atherosclerotic heart disease of native coronary artery without angina pectoris: Secondary | ICD-10-CM | POA: Diagnosis not present

## 2019-03-05 DIAGNOSIS — R0683 Snoring: Secondary | ICD-10-CM | POA: Diagnosis not present

## 2019-03-06 ENCOUNTER — Other Ambulatory Visit: Payer: Self-pay | Admitting: Cardiovascular Disease

## 2019-03-28 DIAGNOSIS — G4733 Obstructive sleep apnea (adult) (pediatric): Secondary | ICD-10-CM | POA: Diagnosis not present

## 2019-09-12 ENCOUNTER — Other Ambulatory Visit: Payer: Self-pay

## 2019-09-12 MED ORDER — ATORVASTATIN CALCIUM 80 MG PO TABS: ORAL_TABLET | ORAL | 1 refills | Status: DC

## 2019-09-12 MED ORDER — NITROGLYCERIN 0.4 MG SL SUBL: 0.4000 mg | SUBLINGUAL_TABLET | SUBLINGUAL | 4 refills | Status: DC | PRN

## 2019-09-12 MED ORDER — METOPROLOL TARTRATE 25 MG PO TABS: 25.0000 mg | ORAL_TABLET | Freq: Two times a day (BID) | ORAL | 1 refills | Status: DC

## 2019-09-12 MED ORDER — CLOPIDOGREL BISULFATE 75 MG PO TABS: 75.0000 mg | ORAL_TABLET | Freq: Every day | ORAL | 1 refills | Status: DC

## 2019-09-12 MED ORDER — ENALAPRIL MALEATE 2.5 MG PO TABS: 2.5000 mg | ORAL_TABLET | Freq: Every day | ORAL | 1 refills | Status: DC

## 2019-10-16 ENCOUNTER — Ambulatory Visit: Payer: Medicare Other | Attending: Internal Medicine

## 2019-10-16 DIAGNOSIS — Z23 Encounter for immunization: Secondary | ICD-10-CM

## 2019-10-16 NOTE — Progress Notes (Signed)
   Covid-19 Vaccination Clinic  Name:  Keith [name]. Odor    MRN: [id number] DOB: 1950/06/07  10/16/2019  [name] was observed post Covid-19 immunization for 15 minutes without incident. He was provided with Vaccine Information Sheet and instruction to access the V-Safe system.   [name] was instructed to call 911 with any severe reactions post vaccine: Marland Kitchen Difficulty breathing  . Swelling of face and throat  . A fast heartbeat  . A bad rash all over body  . Dizziness and weakness

## 2020-01-04 ENCOUNTER — Other Ambulatory Visit: Payer: Self-pay | Admitting: Cardiovascular Disease

## 2020-02-20 DIAGNOSIS — Z125 Encounter for screening for malignant neoplasm of prostate: Secondary | ICD-10-CM | POA: Diagnosis not present

## 2020-02-20 DIAGNOSIS — R7303 Prediabetes: Secondary | ICD-10-CM | POA: Diagnosis not present

## 2020-02-20 DIAGNOSIS — I25119 Atherosclerotic heart disease of native coronary artery with unspecified angina pectoris: Secondary | ICD-10-CM | POA: Diagnosis not present

## 2020-02-20 DIAGNOSIS — Z1159 Encounter for screening for other viral diseases: Secondary | ICD-10-CM | POA: Diagnosis not present

## 2020-02-20 DIAGNOSIS — N529 Male erectile dysfunction, unspecified: Secondary | ICD-10-CM | POA: Diagnosis not present

## 2020-02-20 DIAGNOSIS — E785 Hyperlipidemia, unspecified: Secondary | ICD-10-CM | POA: Diagnosis not present

## 2020-02-20 DIAGNOSIS — I739 Peripheral vascular disease, unspecified: Secondary | ICD-10-CM | POA: Diagnosis not present

## 2020-02-20 DIAGNOSIS — Z0000 Encounter for general adult medical examination without abnormal findings: Secondary | ICD-10-CM | POA: Diagnosis not present

## 2020-02-20 DIAGNOSIS — Z122 Encounter for screening for malignant neoplasm of respiratory organs: Secondary | ICD-10-CM | POA: Diagnosis not present

## 2020-02-20 DIAGNOSIS — I10 Essential (primary) hypertension: Secondary | ICD-10-CM | POA: Diagnosis not present

## 2020-02-20 DIAGNOSIS — R251 Tremor, unspecified: Secondary | ICD-10-CM | POA: Diagnosis not present

## 2020-02-20 DIAGNOSIS — Z23 Encounter for immunization: Secondary | ICD-10-CM | POA: Diagnosis not present

## 2020-02-22 NOTE — Progress Notes (Signed)
Assessment/Plan:   1.   Essential Tremor.  -This is evidenced by the symmetrical nature and longstanding hx of gradually getting worse.  We discussed nature and pathophysiology.  We discussed that this can continue to gradually get worse with time.  We discussed that some medications can worsen this, as can caffeine use.  We discussed medication therapy.Ultimately, the patient decided to hold on any treatment.  He really was not that bothered by tremor, but rather wanted reassurance that he did not have Parkinson's disease.  I reassured him today that this was not present on his exam.  His mother had tremor, and likely had essential tremor.  We will check his TSH today to make sure we are not missing anything.  He can call us back if tremor gets worse or if new neurologic issues arise.   Subjective:   Keith [name]. Corsi was seen today in the movement disorders clinic for neurologic consultation at the request of Lois Huxley, [place].  The consultation is for the evaluation of tremor and shuffling gait and to rule out Parkinson's disease. This patient is accompanied in the office by his spouse who supplements the history.   Specific Symptoms:  Tremor: Yes.   x 1 year, bilateral UE.  Pt states R>L hand.  Wife states mostly at rest.  It affects ability to write.  He is R hand dominant Family hx of similar:  Yes.  , mother with tremor Voice: no change Sleep: trouble staying asleep - does wear cpap  Vivid Dreams:  No.  Acting out dreams:  No. Wet Pillows: Yes.  , but wears cpap Postural symptoms:  No.  Falls?  No. Bradykinesia symptoms: difficulty getting out of a chair (due to back sx); no change in stride length; states that he picks up feet when walks but wife thinks that he shuffles some Loss of smell:  No. Loss of taste:  No. Urinary Incontinence:  No. Difficulty Swallowing:  "not often do things get hung" Handwriting, micrographia: No. Trouble with ADL's:  No.  Trouble buttoning  clothing: No. Depression:  No. Memory changes:  Minor - could be a repetitive story.  Able to get to places without trouble N/V:  No. Lightheaded:  No.  Syncope: No. Diplopia:  Yes.  , occasionally but its momentary - will look away and look back and its singular image Prior exposure to reglan/antipsychotics: No.  Neuroimaging of the brain has not previously been performed.  ALLERGIES:  No Known Allergies  CURRENT MEDICATIONS:  Current Outpatient Medications  Medication Instructions  . aspirin 81 mg, Oral, Daily,    . atorvastatin (LIPITOR) 80 MG tablet TAKE 1 TABLET BY MOUTH  DAILY AT 6 P.M. Please keep upcoming appt in May 2022 with Dr. Angelena Form for future refills. Thank you  . clopidogrel (PLAVIX) 75 MG tablet TAKE 1 TABLET BY MOUTH  DAILY  . enalapril (VASOTEC) 2.5 MG tablet TAKE 1 TABLET BY MOUTH  DAILY  . metoprolol tartrate (LOPRESSOR) 25 MG tablet TAKE 1 TABLET BY MOUTH  TWICE DAILY  . nitroGLYCERIN (NITROSTAT) 0.4 mg, Sublingual, Every 5 min PRN  . pantoprazole (PROTONIX) 40 mg, Oral, Daily    Objective:   VITALS:   Vitals:   02/26/20 0838  BP: 114/70  Pulse: (!) 56  SpO2: 96%  Weight: 197 lb (89.4 kg)  Height: 5' 6.5" (1.689 m)    GEN:  The patient appears stated age and is in NAD. HEENT:  Normocephalic, atraumatic.  The mucous  membranes are moist. The superficial temporal arteries are without ropiness or tenderness. CV:  RRR Lungs:  CTAB Neck/HEME:  There are no carotid bruits bilaterally.  Neurological examination:  Orientation: The patient is alert and oriented x3.  Cranial nerves: There is good facial symmetry. Extraocular muscles are intact. The visual fields are full to confrontational testing. The speech is fluent and clear. Soft palate rises symmetrically and there is no tongue deviation. Hearing is intact to conversational tone. Sensation: Sensation is intact to light and pinprick throughout (facial, trunk, extremities). Vibration is intact at the  bilateral big toe. There is no extinction with double simultaneous stimulation. There is no sensory dermatomal level identified. Motor: Strength is 5/5 in the bilateral upper and lower extremities.   Shoulder shrug is equal and symmetric.  There is no pronator drift. Deep tendon reflexes: Deep tendon reflexes are 2/4 at the bilateral biceps, triceps, brachioradialis, patella and achilles. Plantar responses are downgoing bilaterally.  Movement examination: Tone: There is normal tone in the bilateral upper extremities.  The tone in the lower extremities is normal.  Abnormal movements: No rest tremor, even with distraction procedures.  There is postural tremor.  There is intention tremor, right greater than left.  He does not have significant trouble with Archimedes spirals.  He is able to pour water from 1 glass to another, but I can see the tremor but he does not spill the water. Coordination:  There is no decremation with RAM's, with any form of RAMS, including alternating supination and pronation of the forearm, hand opening and closing, finger taps, heel taps and toe taps. Gait and Station: The patient has no difficulty arising out of a deep-seated chair without the use of the hands. The patient's stride length is good.  The patient is able to ambulate in a tandem fashion.  He is able to stand in the Romberg position.  He is able to heel toe walk.    I have reviewed and interpreted the following labs independently   Chemistry      Component Value Date/Time   NA 141 06/30/2018 0736   K 4.2 06/30/2018 0736   CL 100 06/30/2018 0736   CO2 24 06/30/2018 0736   BUN 16 06/30/2018 0736   CREATININE 1.01 06/30/2018 0736   CREATININE 0.93 02/03/2015 0752      Component Value Date/Time   CALCIUM 9.6 06/30/2018 0736   ALKPHOS 88 06/30/2018 0736   AST 25 06/30/2018 0736   ALT 23 06/30/2018 0736   BILITOT 1.0 06/30/2018 0736      Lab Results  Component Value Date   TSH 0.65 10/08/2009   Lab  Results  Component Value Date   WBC 9.0 10/08/2009   HGB 16.3 10/08/2009   HCT 47.9 10/08/2009   MCV 92.8 10/08/2009   PLT 241.0 10/08/2009   Lab work is done by primary care on 02/20/2020.Marland Kitchen Hemoglobin A1c was 6.3. Sodium was 139, potassium 4.6, chloride 99, CO2 32, BUN 22, creatinine is unreadable on the fax, AST 20, ALT 22. White blood cell 6.2, hemoglobin 16.9, hematocrit 51.2, platelets 215  Total time spent on today's visit was 45 minutes, including both face-to-face time and nonface-to-face time.  Time included that spent on review of records (prior notes available to me/labs/imaging if pertinent), discussing treatment and goals, answering patient's questions and coordinating care.  Cc:  Antony Contras, MD

## 2020-02-26 ENCOUNTER — Other Ambulatory Visit: Payer: Self-pay

## 2020-02-26 ENCOUNTER — Encounter: Payer: Self-pay | Admitting: Neurology

## 2020-02-26 ENCOUNTER — Ambulatory Visit (INDEPENDENT_AMBULATORY_CARE_PROVIDER_SITE_OTHER): Payer: Medicare Other | Admitting: Neurology

## 2020-02-26 ENCOUNTER — Other Ambulatory Visit (INDEPENDENT_AMBULATORY_CARE_PROVIDER_SITE_OTHER): Payer: Medicare Other

## 2020-02-26 VITALS — BP 114/70 | HR 56 | Ht 66.5 in | Wt 197.0 lb

## 2020-02-26 DIAGNOSIS — R251 Tremor, unspecified: Secondary | ICD-10-CM

## 2020-02-26 DIAGNOSIS — G250 Essential tremor: Secondary | ICD-10-CM | POA: Diagnosis not present

## 2020-02-26 LAB — TSH: TSH: 1.27 u[IU]/mL (ref 0.35–4.50)

## 2020-02-26 NOTE — Patient Instructions (Signed)
Your provider has requested that you have labwork completed today. Please go to [facility] ([id number]) on the second floor of this building before leaving the office today. You do not need to check in. If you are not called within 15 minutes please check with the front desk.    Essential Tremor A tremor is trembling or shaking that a person cannot control. Most tremors affect the hands or arms. Tremors can also affect the head, vocal cords, legs, and other parts of the body. Essential tremor is a tremor without a known cause. Usually, it occurs while a person is trying to perform an action. It tends to get worse gradually as a person ages. What are the causes? The cause of this condition is not known. What increases the risk? You are more likely to develop this condition if:  You have a family member with essential tremor.  You are age 68 or older.  You take certain medicines. What are the signs or symptoms? The main sign of a tremor is a rhythmic shaking of certain parts of your body that is uncontrolled and unintentional. You may:  Have difficulty eating with a spoon or fork.  Have difficulty writing.  Nod your head up and down or side to side.  Have a quivering voice. The shaking may:  Get worse over time.  Come and go.  Be more noticeable on one side of your body.  Get worse due to stress, fatigue, caffeine, and extreme heat or cold. How is this diagnosed? This condition may be diagnosed based on:  Your symptoms and medical history.  A physical exam. There is no single test to diagnose an essential tremor. However, your health care provider may order tests to rule out other causes of your condition. These may include:  Blood and urine tests.  Imaging studies of your brain, such as CT scan and MRI.  A test that measures involuntary muscle movement (electromyogram).   How is this treated? Treatment for essential tremor depends on the severity of the  condition.  Some tremors may go away without treatment.  Mild tremors may not need treatment if they do not affect your day-to-day life.  Severe tremors may need to be treated using one or more of the following options: ? Medicines. ? Lifestyle changes. ? Occupational or physical therapy. Follow these instructions at home: Lifestyle  Do not use any products that contain nicotine or tobacco, such as cigarettes and e-cigarettes. If you need help quitting, ask your health care provider.  Limit your caffeine intake as told by your health care provider.  Try to get 8 hours of sleep each night.  Find ways to manage your stress that fits your lifestyle and personality. Consider trying meditation or yoga.  Try to anticipate stressful situations and allow extra time to manage them.  If you are struggling emotionally with the effects of your tremor, consider working with a mental health provider.   General instructions  Take over-the-counter and prescription medicines only as told by your health care provider.  Avoid extreme heat and extreme cold.  Keep all follow-up visits as told by your health care provider. This is important. Visits may include physical therapy visits. Contact a health care provider if:  You experience any changes in the location or intensity of your tremors.  You start having a tremor after starting a new medicine.  You have tremor with other symptoms, such as: ? Numbness. ? Tingling. ? Pain. ? Weakness.  Your  tremor gets worse.  Your tremor interferes with your daily life.  You feel down, blue, or sad for at least 2 weeks in a row.  Worrying about your tremor and what other people think about you interferes with your everyday life functions, including relationships, work, or school. Summary  Essential tremor is a tremor without a known cause. Usually, it occurs when you are trying to perform an action.  You are more likely to develop this condition  if you have a family member with essential tremor.  The main sign of a tremor is a rhythmic shaking of certain parts of your body that is uncontrolled and unintentional.  Treatment for essential tremor depends on the severity of the condition. This information is not intended to replace advice given to you by your health care provider. Make sure you discuss any questions you have with your health care provider. Document Revised: 09/14/2019 Document Reviewed: 09/14/2019 Elsevier Patient Education  2021 [employer].

## 2020-03-17 ENCOUNTER — Other Ambulatory Visit: Payer: Self-pay | Admitting: *Deleted

## 2020-03-17 ENCOUNTER — Telehealth: Payer: Self-pay | Admitting: Internal Medicine

## 2020-03-17 DIAGNOSIS — Z87891 Personal history of nicotine dependence: Secondary | ICD-10-CM

## 2020-03-17 NOTE — Telephone Encounter (Signed)
Patient is scheduled to see Dr. Shearon Stalls tomorrow at [facility]. Per the referral, visit is for the lung cancer screening program. I called [facility] at [facility] and asked to speak with the referrals coordinator to make sure this was a lung cancer screening referral. She did not answer so I had to leave a VM. I asked that she call us back ASAP so that the patient can be cancelled and rescheduled.   Will leave this encounter open.

## 2020-03-18 ENCOUNTER — Institutional Professional Consult (permissible substitution): Payer: Medicare Other | Admitting: Internal Medicine

## 2020-04-30 ENCOUNTER — Encounter: Payer: Self-pay | Admitting: Acute Care

## 2020-04-30 ENCOUNTER — Other Ambulatory Visit: Payer: Self-pay

## 2020-04-30 ENCOUNTER — Ambulatory Visit
Admission: RE | Admit: 2020-04-30 | Discharge: 2020-04-30 | Disposition: A | Discharge status: Home / Self Care | Payer: Medicare Other | Source: Ambulatory Visit | Attending: Acute Care | Admitting: Acute Care

## 2020-04-30 ENCOUNTER — Ambulatory Visit (INDEPENDENT_AMBULATORY_CARE_PROVIDER_SITE_OTHER): Payer: Medicare Other | Admitting: Acute Care

## 2020-04-30 DIAGNOSIS — F17210 Nicotine dependence, cigarettes, uncomplicated: Secondary | ICD-10-CM | POA: Diagnosis not present

## 2020-04-30 DIAGNOSIS — Z87891 Personal history of nicotine dependence: Secondary | ICD-10-CM | POA: Diagnosis not present

## 2020-04-30 NOTE — Progress Notes (Signed)
Virtual Visit via Telephone Note  I connected with Keith [name] on 04/30/20 at  9:30 AM EDT by telephone and verified that I am speaking with the correct person using two identifiers.  Location: Patient: At home Provider: [place], [place], [place], [address]   I discussed the limitations, risks, security and privacy concerns of performing an evaluation and management service by telephone and the availability of in person appointments. I also discussed with the patient that there may be a patient responsible charge related to this service. The patient expressed understanding and agreed to proceed.    Shared Decision Making Visit Lung Cancer Screening Program [phone])   Eligibility:  Age 70 y.o.  Pack Years Smoking History Calculation 90 pack years (# packs/per year x # years smoked)  Recent History of coughing up blood  no  Unexplained weight loss? no ( >Than 15 pounds within the last 6 months )  Prior History Lung / other cancer no (Diagnosis within the last 5 years already requiring surveillance chest CT Scans).  Smoking Status Current Smoker  Former Smokers: Years since quit: NA  Quit Date: NA  Visit Components:  Discussion included one or more decision making aids. yes  Discussion included risk/benefits of screening. yes  Discussion included potential follow up diagnostic testing for abnormal scans. yes  Discussion included meaning and risk of over diagnosis. yes  Discussion included meaning and risk of False Positives. yes  Discussion included meaning of total radiation exposure. yes  Counseling Included:  Importance of adherence to annual lung cancer LDCT screening. yes  Impact of comorbidities on ability to participate in the program. yes  Ability and willingness to under diagnostic treatment. yes  Smoking Cessation Counseling:  Current Smokers:   Discussed importance of smoking cessation. yes  Information about tobacco  cessation classes and interventions provided to patient. yes  Patient provided with "ticket" for LDCT Scan. yes  Symptomatic Patient. no  Counseling NA  Diagnosis Code: Tobacco Use Z72.0  Asymptomatic Patient yes  Counseling (Intermediate counseling: > three minutes counseling) R5188  Former Smokers:   Discussed the importance of maintaining cigarette abstinence. yes  Diagnosis Code: Personal History of Nicotine Dependence. C16.606  Information about tobacco cessation classes and interventions provided to patient. Yes  Patient provided with "ticket" for LDCT Scan. yes  Written Order for Lung Cancer Screening with LDCT placed in Epic. Yes (CT Chest Lung Cancer Screening Low Dose W/O CM) [id number] Z12.2-Screening of respiratory organs Z87.891-Personal history of nicotine dependence  Pt. States he continues to smoke 1-3 cigarettes a month.   I have spent 25 minutes of face to face time with Mr. Koelzer discussing the risks and benefits of lung cancer screening. We viewed a power point together that explained in detail the above noted topics. We paused at intervals to allow for questions to be asked and answered to ensure understanding.We discussed that the single most powerful action that he can take to decrease his risk of developing lung cancer is to quit smoking. We discussed whether or not he is ready to commit to setting a quit date. We discussed options for tools to aid in quitting smoking including nicotine replacement therapy, non-nicotine medications, support groups, Quit Smart classes, and behavior modification. We discussed that often times setting smaller, more achievable goals, such as eliminating 1 cigarette a day for a week and then 2 cigarettes a day for a week can be helpful in slowly decreasing the number of cigarettes smoked. This allows for  a sense of accomplishment as well as providing a clinical benefit. I gave him the " Be Stronger Than Your Excuses" card with contact  information for community resources, classes, free nicotine replacement therapy, and access to mobile apps, text messaging, and on-line smoking cessation help. I have also given him my card and contact information in the event he needs to contact me. We discussed the time and location of the scan, and that either Doroteo Glassman RN or I will call with the results within 24-48 hours of receiving them. I have offered him  a copy of the power point we viewed  as a resource in the event they need reinforcement of the concepts we discussed today in the office. The patient verbalized understanding of all of  the above and had no further questions upon leaving the office. They have my contact information in the event they have any further questions.  I spent 4 minutes counseling on smoking cessation and the health risks of continued tobacco abuse.  I explained to the patient that there has been a high incidence of coronary artery disease noted on these exams. I explained that this is a non-gated exam therefore degree or severity cannot be determined. This patient is currently on statin therapy. I have asked the patient to follow-up with their PCP regarding any incidental finding of coronary artery disease and management with diet or medication as their PCP  feels is clinically indicated. The patient verbalized understanding of the above and had no further questions upon completion of the visit.   This appointment was over 25  minutes long with over 50% of the time being indirect via telephone  face to face patient care, assessment , plan of care , and follow up,    Magdalen Spatz, NP 04/30/2020

## 2020-04-30 NOTE — Patient Instructions (Signed)
Thank you for participating in the [facility] Lung Cancer Screening Program. It was our pleasure to meet you today. We will call you with the results of your scan within the next few days. Your scan will be assigned a Lung RADS category score by the physicians reading the scans.  This Lung RADS score determines follow up scanning.  See below for description of categories, and follow up screening recommendations. We will be in touch to schedule your follow up screening annually or based on recommendations of our providers. We will fax a copy of your scan results to your Primary Care Physician, or the physician who referred you to the program, to ensure they have the results. Please call the office if you have any questions or concerns regarding your scanning experience or results.  Our office number is [phone]. Please speak with Denise Phelps, RN. She is our Lung Cancer Screening RN. If she is unavailable when you call, please have the office staff send her a message. She will return your call at her earliest convenience. Remember, if your scan is normal, we will scan you annually as long as you continue to meet the criteria for the program. (Age 55-77, Current smoker or smoker who has quit within the last 15 years). If you are a smoker, remember, quitting is the single most powerful action that you can take to decrease your risk of lung cancer and other pulmonary, breathing related problems. We know quitting is hard, and we are here to help.  Please let us know if there is anything we can do to help you meet your goal of quitting. If you are a former smoker, congratulations. We are proud of you! Remain smoke free! Remember you can refer friends or family members through the number above.  We will screen them to make sure they meet criteria for the program. Thank you for helping us take better care of you by participating in Lung Screening.  Lung RADS Categories:  Lung RADS 1: no nodules  or definitely non-concerning nodules.  Recommendation is for a repeat annual scan in 12 months.  Lung RADS 2:  nodules that are non-concerning in appearance and behavior with a very low likelihood of becoming an active cancer. Recommendation is for a repeat annual scan in 12 months.  Lung RADS 3: nodules that are probably non-concerning , includes nodules with a low likelihood of becoming an active cancer.  Recommendation is for a 6-month repeat screening scan. Often noted after an upper respiratory illness. We will be in touch to make sure you have no questions, and to schedule your 6-month scan.  Lung RADS 4 A: nodules with concerning findings, recommendation is most often for a follow up scan in 3 months or additional testing based on our provider's assessment of the scan. We will be in touch to make sure you have no questions and to schedule the recommended 3 month follow up scan.  Lung RADS 4 B:  indicates findings that are concerning. We will be in touch with you to schedule additional diagnostic testing based on our provider's  assessment of the scan.   

## 2020-05-09 ENCOUNTER — Other Ambulatory Visit: Payer: Self-pay

## 2020-05-09 ENCOUNTER — Ambulatory Visit (INDEPENDENT_AMBULATORY_CARE_PROVIDER_SITE_OTHER): Payer: Medicare Other | Admitting: Cardiovascular Disease

## 2020-05-09 ENCOUNTER — Encounter: Payer: Self-pay | Admitting: Cardiovascular Disease

## 2020-05-09 VITALS — BP 132/70 | HR 55 | Ht 66.5 in | Wt 199.0 lb

## 2020-05-09 DIAGNOSIS — F17201 Nicotine dependence, unspecified, in remission: Secondary | ICD-10-CM

## 2020-05-09 DIAGNOSIS — I739 Peripheral vascular disease, unspecified: Secondary | ICD-10-CM

## 2020-05-09 DIAGNOSIS — E7800 Pure hypercholesterolemia, unspecified: Secondary | ICD-10-CM | POA: Diagnosis not present

## 2020-05-09 DIAGNOSIS — I2510 Atherosclerotic heart disease of native coronary artery without angina pectoris: Secondary | ICD-10-CM | POA: Diagnosis not present

## 2020-05-09 MED ORDER — CLOPIDOGREL BISULFATE 75 MG PO TABS: 1.0000 | ORAL_TABLET | Freq: Every day | ORAL | 3 refills | Status: DC

## 2020-05-09 MED ORDER — ENALAPRIL MALEATE 2.5 MG PO TABS: 2.5000 mg | ORAL_TABLET | Freq: Every day | ORAL | 3 refills | Status: DC

## 2020-05-09 MED ORDER — EZETIMIBE 10 MG PO TABS: 10.0000 mg | ORAL_TABLET | Freq: Every day | ORAL | 3 refills | Status: DC

## 2020-05-09 MED ORDER — ATORVASTATIN CALCIUM 80 MG PO TABS: ORAL_TABLET | ORAL | 3 refills | Status: DC

## 2020-05-09 MED ORDER — METOPROLOL TARTRATE 25 MG PO TABS: 25.0000 mg | ORAL_TABLET | Freq: Two times a day (BID) | ORAL | 3 refills | Status: DC

## 2020-05-09 NOTE — Progress Notes (Signed)
Chief Complaint  Patient presents with  . Follow-up    CAD   History of Present Illness: 70 yo male with history of CAD, HLD and PAD here today for cardiac follow up.  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 His ejection fraction was 60% at that time. He is a former smoker. He has not smoked since 2009. He was admitted to [facility] in [place] in November 2013 with an acute inferolateral STEMI secondary to an occluded left Circumflex treated with a 3.0 x22 Resolute Integrity DES. His EF was 50%. RCA stent patent with mild restenosis. LAD with mild 25% plaque mid. Echo November 2013 with LVEF=55-60%. Exercise stress January 2016 without ischemia. He is known to have PAD with last ABI in December 2016 with bilateral ABI 0.93.    He is here today for follow up. The patient denies any chest pain, dyspnea, palpitations, lower extremity edema, orthopnea, PND, dizziness, near syncope or syncope.  .  Primary Care Physician: Antony Contras, MD  Past Medical History:  Diagnosis Date  . CAD (coronary artery disease)   . CAD, NATIVE VESSEL    a. LHC in the setting of inferior STEMI in 10/09 demonstrating a mid RCA 99%, proximal LAD 70% and an EF of 60%. He underwent placement of a BMS to the RCA at that time;  b. Inf-Lat [id number] [facility]): s/p 3x22 Resolute DES to CFX, EF 50% with mild lat HK  . GERD (gastroesophageal reflux disease)   . Hx of echocardiogram    a. Echo 11/08/11: EF 55-60%, normal wall motion, normal diastolic function.   Marland Kitchen HYPERLIPIDEMIA-MIXED   . Nephrolithiasis   . PAD (peripheral artery disease) ([place])     Past Surgical History:  Procedure Laterality Date  . CORONARY STENT PLACEMENT    . Lower Back Surgery     x 2    Current Outpatient Medications  Medication Sig Dispense Refill  . aspirin 81 MG tablet Take 81 mg by mouth daily.    Marland Kitchen ezetimibe (ZETIA) 10 MG tablet  Take 1 tablet (10 mg total) by mouth daily. 90 tablet 3  . nitroGLYCERIN (NITROSTAT) 0.4 MG SL tablet Place 1 tablet (0.4 mg total) under the tongue every 5 (five) minutes as needed for chest pain. 25 tablet 4  . pantoprazole (PROTONIX) 40 MG tablet Take 40 mg by mouth daily.    Marland Kitchen atorvastatin (LIPITOR) 80 MG tablet TAKE 1 TABLET BY MOUTH  DAILY AT 6 P.M 90 tablet 3  . clopidogrel (PLAVIX) 75 MG tablet Take 1 tablet (75 mg total) by mouth daily. 90 tablet 3  . enalapril (VASOTEC) 2.5 MG tablet Take 1 tablet (2.5 mg total) by mouth daily. 90 tablet 3  . metoprolol tartrate (LOPRESSOR) 25 MG tablet Take 1 tablet (25 mg total) by mouth 2 (two) times daily. 180 tablet 3   No current facility-administered medications for this visit.    No Known Allergies  Social History   Socioeconomic History  . Marital status: Married    Spouse name: Not on file  . Number of children: Not on file  . Years of education: Not on file  . Highest education level: Not on file  Occupational History  . Not on file  Tobacco Use  . Smoking status: Former Smoker    Packs/day: 2.00    Years: 45.00    Pack years: 90.00  Types: Cigarettes    Quit date: 10/24/2007    Years since quitting: 12.5  . Smokeless tobacco: Never Used  Vaping Use  . Vaping Use: Never used  Substance and Sexual Activity  . Alcohol use: Yes    Comment: 1 x per year  . Drug use: Never  . Sexual activity: Not on file  Other Topics Concern  . Not on file  Social History Narrative  . Not on file   Social Determinants of Health   Financial Resource Strain: Not on file  Food Insecurity: Not on file  Transportation Needs: Not on file  Physical Activity: Not on file  Stress: Not on file  Social Connections: Not on file  Intimate Partner Violence: Not on file    Family History  Problem Relation Age of Onset  . Heart attack Mother   . Heart attack Father   . Heart disease Sister   . Heart attack Sister   . Coronary artery  disease Other     Review of Systems:  As stated in the HPI and otherwise negative.   BP 132/70   Pulse (!) 55   Ht 5' 6.5" (1.689 m)   Wt 199 lb (90.3 kg)   SpO2 96%   BMI 31.64 kg/m   Physical Examination:  General: Well developed, well nourished, NAD  HEENT: OP clear, mucus membranes moist  SKIN: warm, dry. No rashes. Neuro: No focal deficits  Musculoskeletal: Muscle strength 5/5 all ext  Psychiatric: Mood and affect normal  Neck: No JVD, no carotid bruits, no thyromegaly, no lymphadenopathy.  Lungs:Clear bilaterally, no wheezes, rhonci, crackles Cardiovascular: Regular rate and rhythm. No murmurs, gallops or rubs. Abdomen:Soft. Bowel sounds present. Non-tender.  Extremities: No lower extremity edema. Pulses are 2 + in the bilateral DP/PT.  EKG:  EKG is ordered today. The ekg ordered today demonstrates Sinus bradycardia, rate 55 bpm. Non-specific T wave abn.  Recent Labs: 02/26/2020: TSH 1.27   Lipid Panel    Component Value Date/Time   CHOL 140 06/30/2018 0736   TRIG 121 06/30/2018 0736   HDL 41 06/30/2018 0736   CHOLHDL 3.4 06/30/2018 0736   CHOLHDL 3.7 02/03/2015 0752   VLDL 21 02/03/2015 0752   LDLCALC 75 06/30/2018 0736     Wt Readings from Last 3 Encounters:  05/09/20 199 lb (90.3 kg)  02/26/20 197 lb (89.4 kg)  01/29/19 202 lb (91.6 kg)     Assessment and Plan:   1. CAD without angina: He has no chest pain. Will continue ASA, Plavix, statin and beta blocker.     2. HYPERLIPIDEMIA: Lipids followed in primary care. LDL 95 in February 2022. Continue statin. Will add Zetia 10 mg daily.   3. PAD: ABI December 2016 stable, 0.93 bilaterally. He has had no symptoms c/w claudication. We discussed repeating his ABI but he does not wish to do this today.   4. Tobacco abuse, in remission: He no longer smokes  Current medicines are reviewed at length with the patient today.  The patient does not have concerns regarding medicines.  The following changes have  been made:  no change  Labs/ tests ordered today include:   Orders Placed This Encounter  Procedures  . EKG 12-Lead    Disposition:   F/U with me in 12  months  Signed, Lauree Chandler, MD 05/09/2020 9:23 AM    [facility] [address], [place], [place]  [address] Phone: [phone]; Fax: [phone]

## 2020-05-09 NOTE — Patient Instructions (Signed)
Medication Instructions:  Start Zetia 10 mg daily *If you need a refill on your cardiac medications before your next appointment, please call your pharmacy*   Lab Work: None If you have labs (blood work) drawn today and your tests are completely normal, you will receive your results only by: Marland Kitchen MyChart Message (if you have MyChart) OR . A paper copy in the mail If you have any lab test that is abnormal or we need to change your treatment, we will call you to review the results.   Testing/Procedures: None   Follow-Up: At [facility], you and your health needs are our priority.  As part of our continuing mission to provide you with exceptional heart care, we have created designated Provider Care Teams.  These Care Teams include your primary Cardiologist (physician) and Advanced Practice Providers (APPs -  Physician Assistants and Nurse Practitioners) who all work together to provide you with the care you need, when you need it.  We recommend signing up for the patient portal called "MyChart".  Sign up information is provided on this After Visit Summary.  MyChart is used to connect with patients for Virtual Visits (Telemedicine).  Patients are able to view lab/test results, encounter notes, upcoming appointments, etc.  Non-urgent messages can be sent to your provider as well.   To learn more about what you can do with MyChart, go to [email or web address].    Your next appointment:   1 year(s)  The format for your next appointment:   In Person  Provider:   You may see Lauree Chandler, MD or one of the following Advanced Practice Providers on your designated Care Team:    Melina Copa, PA-C  Ermalinda Barrios, PA-C    Other Instructions

## 2020-05-10 NOTE — Progress Notes (Signed)
Please call patient and let them  know their  low dose Ct was read as a Lung RADS 2: nodules that are benign in appearance and behavior with a very low likelihood of becoming a clinically active cancer due to size or lack of growth. Recommendation per radiology is for a repeat LDCT in 12 months. .Please let them  know we will order and schedule their  annual screening scan for 04/2021. Please let them  know there was notation of CAD on their  scan.  Please remind the patient  that this is a non-gated exam therefore degree or severity of disease  cannot be determined. Please have them  follow up with their PCP regarding potential risk factor modification, dietary therapy or pharmacologic therapy if clinically indicated. Pt.  is  currently on statin therapy. Please place order for annual  screening scan for  04/2021 and fax results to PCP. Thanks so much.  Pt has CAD. He is taking a statin, He is followed by cardiology.

## 2020-05-12 ENCOUNTER — Other Ambulatory Visit: Payer: Self-pay | Admitting: *Deleted

## 2020-05-12 MED ORDER — EZETIMIBE 10 MG PO TABS: 10.0000 mg | ORAL_TABLET | Freq: Every day | ORAL | 3 refills | Status: DC

## 2020-05-13 ENCOUNTER — Other Ambulatory Visit: Payer: Self-pay | Admitting: *Deleted

## 2020-05-13 ENCOUNTER — Encounter: Payer: Self-pay | Admitting: *Deleted

## 2020-05-13 DIAGNOSIS — Z87891 Personal history of nicotine dependence: Secondary | ICD-10-CM

## 2020-05-26 DIAGNOSIS — Z23 Encounter for immunization: Secondary | ICD-10-CM | POA: Diagnosis not present

## 2020-06-16 ENCOUNTER — Other Ambulatory Visit: Payer: Self-pay

## 2020-06-16 MED ORDER — ATORVASTATIN CALCIUM 80 MG PO TABS: ORAL_TABLET | ORAL | 3 refills | Status: DC

## 2020-06-16 MED ORDER — CLOPIDOGREL BISULFATE 75 MG PO TABS: 1.0000 | ORAL_TABLET | Freq: Every day | ORAL | 3 refills | Status: DC

## 2020-06-16 MED ORDER — ENALAPRIL MALEATE 2.5 MG PO TABS: 2.5000 mg | ORAL_TABLET | Freq: Every day | ORAL | 3 refills | Status: DC

## 2020-06-16 MED ORDER — METOPROLOL TARTRATE 25 MG PO TABS: 25.0000 mg | ORAL_TABLET | Freq: Two times a day (BID) | ORAL | 3 refills | Status: DC

## 2020-06-16 NOTE — Addendum Note (Signed)
Addended by: Carter Kitten D on: 06/16/2020 08:02 AM   Modules accepted: Orders

## 2020-06-16 NOTE — Addendum Note (Signed)
Addended by: Carter Kitten D on: 06/16/2020 08:01 AM   Modules accepted: Orders

## 2020-06-16 NOTE — Addendum Note (Signed)
Addended by: Carter Kitten D on: 06/16/2020 08:03 AM   Modules accepted: Orders

## 2020-06-30 DIAGNOSIS — G4733 Obstructive sleep apnea (adult) (pediatric): Secondary | ICD-10-CM | POA: Diagnosis not present

## 2020-09-15 ENCOUNTER — Other Ambulatory Visit: Payer: Self-pay | Admitting: *Deleted

## 2020-09-15 MED ORDER — NITROGLYCERIN 0.4 MG SL SUBL: 0.4000 mg | SUBLINGUAL_TABLET | SUBLINGUAL | 4 refills | Status: AC | PRN

## 2020-10-11 DIAGNOSIS — Z23 Encounter for immunization: Secondary | ICD-10-CM | POA: Diagnosis not present

## 2021-03-19 DIAGNOSIS — J439 Emphysema, unspecified: Secondary | ICD-10-CM | POA: Diagnosis not present

## 2021-03-19 DIAGNOSIS — Z1389 Encounter for screening for other disorder: Secondary | ICD-10-CM | POA: Diagnosis not present

## 2021-03-19 DIAGNOSIS — Z0000 Encounter for general adult medical examination without abnormal findings: Secondary | ICD-10-CM | POA: Diagnosis not present

## 2021-03-19 DIAGNOSIS — E7800 Pure hypercholesterolemia, unspecified: Secondary | ICD-10-CM | POA: Diagnosis not present

## 2021-03-19 DIAGNOSIS — G4733 Obstructive sleep apnea (adult) (pediatric): Secondary | ICD-10-CM | POA: Diagnosis not present

## 2021-03-19 DIAGNOSIS — Z125 Encounter for screening for malignant neoplasm of prostate: Secondary | ICD-10-CM | POA: Diagnosis not present

## 2021-03-19 DIAGNOSIS — I25119 Atherosclerotic heart disease of native coronary artery with unspecified angina pectoris: Secondary | ICD-10-CM | POA: Diagnosis not present

## 2021-03-19 DIAGNOSIS — I700 Atherosclerosis of aorta: Secondary | ICD-10-CM | POA: Diagnosis not present

## 2021-03-19 DIAGNOSIS — Z23 Encounter for immunization: Secondary | ICD-10-CM | POA: Diagnosis not present

## 2021-03-19 DIAGNOSIS — Z136 Encounter for screening for cardiovascular disorders: Secondary | ICD-10-CM | POA: Diagnosis not present

## 2021-03-19 DIAGNOSIS — R7303 Prediabetes: Secondary | ICD-10-CM | POA: Diagnosis not present

## 2021-03-19 DIAGNOSIS — N529 Male erectile dysfunction, unspecified: Secondary | ICD-10-CM | POA: Diagnosis not present

## 2021-03-20 ENCOUNTER — Other Ambulatory Visit: Payer: Self-pay | Admitting: Family Medicine

## 2021-03-20 DIAGNOSIS — Z136 Encounter for screening for cardiovascular disorders: Secondary | ICD-10-CM

## 2021-04-02 ENCOUNTER — Ambulatory Visit
Admission: RE | Admit: 2021-04-02 | Discharge: 2021-04-02 | Disposition: A | Discharge status: Home / Self Care | Payer: Medicare Other | Source: Ambulatory Visit | Attending: Family Medicine | Admitting: Family Medicine

## 2021-04-02 DIAGNOSIS — Z136 Encounter for screening for cardiovascular disorders: Secondary | ICD-10-CM | POA: Diagnosis not present

## 2021-05-06 ENCOUNTER — Other Ambulatory Visit: Payer: Self-pay | Admitting: Cardiovascular Disease

## 2021-06-04 NOTE — Progress Notes (Unsigned)
Office Note     CC:  AAA 4.4cm Requesting Provider:  Antony Contras, MD  HPI: Keith [name] is a 71 y.o. 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  Exam today, Keith [name] is doing well, accompanied by his wife. Originally from [place], Keith [name] moved to [place] at a young age as his mother's family is from the area.  Truck driver by trade, he is now retired.  Keith [name] has previous history of 2 MIs, chronic back pain.  He denies acute abdominal, back, chest pain.  Keith [name] has a cigarette intermittently, however this is less than 1/day.  Medications include daily aspirin, statin therapy. This father passed away suddenly at the age of 36, this was deemed a heart attack.  He has 3 sisters, all of which have coronary artery disease.  He is unsure as to whether they have been checked for AAA. Keith [name] denies symptoms of claudication, rest pain, tissue loss in the lower extremities.  He does have intermittent cramping, however this is not associated with ambulation.   Past Medical History:  Diagnosis Date   CAD (coronary artery disease)    CAD, NATIVE VESSEL    a. LHC in the setting of inferior STEMI in 10/09 demonstrating a mid RCA 99%, proximal LAD 70% and an EF of 60%. He underwent placement of a BMS to the RCA at that time;  b. Inf-Lat [id number] [facility]): s/p 3x22 Resolute DES to CFX, EF 50% with mild lat HK   GERD (gastroesophageal reflux disease)    Hx of echocardiogram    a. Echo 11/08/11: EF 55-60%, normal wall motion, normal diastolic function.    HYPERLIPIDEMIA-MIXED    Nephrolithiasis    PAD (peripheral artery disease) (HCC)     Past Surgical History:  Procedure Laterality Date   CORONARY STENT PLACEMENT     Lower Back Surgery     x 2    Social History   Socioeconomic History   Marital status: Married    Spouse name: Not on file   Number of children: Not on  file   Years of education: Not on file   Highest education level: Not on file  Occupational History   Not on file  Tobacco Use   Smoking status: Former    Packs/day: 2.00    Years: 45.00    Pack years: 90.00    Types: Cigarettes    Quit date: 10/24/2007    Years since quitting: 13.6   Smokeless tobacco: Never  Vaping Use   Vaping Use: Never used  Substance and Sexual Activity   Alcohol use: Yes    Comment: 1 x per year   Drug use: Never   Sexual activity: Not on file  Other Topics Concern   Not on file  Social History Narrative   Not on file   Social Determinants of Health   Financial Resource Strain: Not on file  Food Insecurity: Not on file  Transportation Needs: Not on file  Physical Activity: Not on file  Stress: Not on file  Social Connections: Not on file  Intimate Partner Violence: Not on file   Family History  Problem Relation Age of Onset   Heart attack Mother    Heart attack Father    Heart disease Sister    Heart attack Sister    Coronary artery disease Other     Current Outpatient Medications  Medication Sig Dispense Refill  aspirin 81 MG tablet Take 81 mg by mouth daily.     atorvastatin (LIPITOR) 80 MG tablet TAKE 1 TABLET BY MOUTH  DAILY AT 6 PM 90 tablet 0   clopidogrel (PLAVIX) 75 MG tablet TAKE 1 TABLET BY MOUTH  DAILY 90 tablet 0   enalapril (VASOTEC) 2.5 MG tablet TAKE 1 TABLET BY MOUTH  DAILY 90 tablet 0   ezetimibe (ZETIA) 10 MG tablet TAKE 1 TABLET BY MOUTH  DAILY 90 tablet 0   metoprolol tartrate (LOPRESSOR) 25 MG tablet TAKE 1 TABLET BY MOUTH  TWICE DAILY 180 tablet 0   nitroGLYCERIN (NITROSTAT) 0.4 MG SL tablet Place 1 tablet (0.4 mg total) under the tongue every 5 (five) minutes as needed for chest pain. 25 tablet 4   pantoprazole (PROTONIX) 40 MG tablet Take 40 mg by mouth daily.     No current facility-administered medications for this visit.    No Known Allergies   REVIEW OF SYSTEMS:  '[X]'$  denotes positive finding, '[ ]'$   denotes negative finding Cardiac  Comments:  Chest pain or chest pressure:    Shortness of breath upon exertion:    Short of breath when lying flat:    Irregular heart rhythm:        Vascular    Pain in calf, thigh, or hip brought on by ambulation:    Pain in feet at night that wakes you up from your sleep:     Blood clot in your veins:    Leg swelling:         Pulmonary    Oxygen at home:    Productive cough:     Wheezing:         Neurologic    Sudden weakness in arms or legs:     Sudden numbness in arms or legs:     Sudden onset of difficulty speaking or slurred speech:    Temporary loss of vision in one eye:     Problems with dizziness:         Gastrointestinal    Blood in stool:     Vomited blood:         Genitourinary    Burning when urinating:     Blood in urine:        Psychiatric    Major depression:         Hematologic    Bleeding problems:    Problems with blood clotting too easily:        Skin    Rashes or ulcers:        Constitutional    Fever or chills:      PHYSICAL EXAMINATION:  There were no vitals filed for this visit.  General:  WDWN in NAD; vital signs documented above Gait: Not observed HENT: WNL, normocephalic Pulmonary: normal non-labored breathing , without wheezing Cardiac: regular HR, Abdomen: soft, NT, no masses Skin: without rashes Vascular Exam/Pulses:  Right Left  Radial 2+ (normal) 2+ (normal)  Ulnar 2+ (normal) 2+ (normal)  Femoral    Popliteal Not palpable Not palpable  DP 2+ (normal) 2+ (normal)  PT 2+ (normal) 2+ (normal)   Extremities: without ischemic changes, without Gangrene , without cellulitis; without open wounds;  Musculoskeletal: no muscle wasting or atrophy  Neurologic: A&O X 3;  No focal weakness or paresthesias are detected Psychiatric:  The pt has Normal affect.   Non-Invasive Vascular Imaging:   FINDINGS: Abdominal aortic measurements as follows:   Proximal:  3.2 x 3.1 cm  Mid:  3.1 x 3.1  cm   Distal:  4.4 x 4.0 cm   Aortic atherosclerosis is noted.    ASSESSMENT/PLAN: Keith [name] is a 71 y.o. 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  I had a long discussion with Keith [name] and his wife regarding his asymptomatic infrarenal abdominal aneurysm, and the natural history associated.  This would benefit from repair at 5.5 cm per Society for vascular surgery guidelines. My plan is to see Keith [name] yearly to track growth.  Asked that he continue his current medication regimen and stop smoking completely. I counseled Keith [name] on the signs and symptoms of rupture, and asked that he call 9 1 immediately should any of these occur.   Keith John, MD [facility] [phone]

## 2021-06-05 ENCOUNTER — Encounter: Payer: Self-pay | Admitting: Vascular Surgery

## 2021-06-05 ENCOUNTER — Ambulatory Visit (INDEPENDENT_AMBULATORY_CARE_PROVIDER_SITE_OTHER): Payer: Medicare Other | Admitting: Vascular Surgery

## 2021-06-05 VITALS — BP 145/76 | HR 57 | Temp 97.9°F | Resp 20 | Ht 66.5 in | Wt 204.2 lb

## 2021-06-05 DIAGNOSIS — I7143 Infrarenal abdominal aortic aneurysm, without rupture: Secondary | ICD-10-CM

## 2021-07-28 ENCOUNTER — Other Ambulatory Visit: Payer: Self-pay | Admitting: Cardiovascular Disease

## 2021-07-29 DIAGNOSIS — G4733 Obstructive sleep apnea (adult) (pediatric): Secondary | ICD-10-CM | POA: Diagnosis not present

## 2021-08-04 NOTE — Progress Notes (Unsigned)
No chief complaint on file.  History of Present Illness: 71 yo male with history of CAD, HLD, AAA and PAD here today for cardiac follow up.  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 His ejection fraction was 60% at that time. He is a former smoker. He has not smoked since 2009. He was admitted to [facility] in [place] in November 2013 with an acute inferolateral STEMI secondary to an occluded left Circumflex treated with a 3.0 x22 Resolute Integrity DES. His EF was 50%. RCA stent patent with mild restenosis. LAD with mild 25% plaque mid. Echo November 2013 with LVEF=55-60%. Exercise stress January 2016 without ischemia. He is known to have PAD with last ABI in December 2016 with bilateral ABI 0.93.  He has been found to have a 4.4 cm AAA and is now followed in the [facility] office by Dr. Unk Lightning.   He is here today for follow up. The patient denies any chest pain, dyspnea, palpitations, lower extremity edema, orthopnea, PND, dizziness, near syncope or syncope.    Primary Care Physician: Antony Contras, MD  Past Medical History:  Diagnosis Date   CAD (coronary artery disease)    CAD, NATIVE VESSEL    a. LHC in the setting of inferior STEMI in 10/09 demonstrating a mid RCA 99%, proximal LAD 70% and an EF of 60%. He underwent placement of a BMS to the RCA at that time;  b. Inf-Lat [id number] [facility]): s/p 3x22 Resolute DES to CFX, EF 50% with mild lat HK   GERD (gastroesophageal reflux disease)    Hx of echocardiogram    a. Echo 11/08/11: EF 55-60%, normal wall motion, normal diastolic function.    HYPERLIPIDEMIA-MIXED    Nephrolithiasis    PAD (peripheral artery disease) (HCC)     Past Surgical History:  Procedure Laterality Date   CORONARY STENT PLACEMENT     Lower Back Surgery     x 2    Current Outpatient Medications  Medication Sig Dispense Refill   aspirin 81 MG tablet Take 81 mg  by mouth daily.     atorvastatin (LIPITOR) 80 MG tablet TAKE 1 TABLET BY MOUTH DAILY AT  6 PM 90 tablet 0   clopidogrel (PLAVIX) 75 MG tablet TAKE 1 TABLET BY MOUTH DAILY 90 tablet 0   enalapril (VASOTEC) 2.5 MG tablet TAKE 1 TABLET BY MOUTH DAILY 90 tablet 0   ezetimibe (ZETIA) 10 MG tablet TAKE 1 TABLET BY MOUTH DAILY 90 tablet 0   metoprolol tartrate (LOPRESSOR) 25 MG tablet TAKE 1 TABLET BY MOUTH TWICE  DAILY 180 tablet 0   nitroGLYCERIN (NITROSTAT) 0.4 MG SL tablet Place 1 tablet (0.4 mg total) under the tongue every 5 (five) minutes as needed for chest pain. 25 tablet 4   pantoprazole (PROTONIX) 40 MG tablet Take 40 mg by mouth daily.     sildenafil (REVATIO) 20 MG tablet Take 40-100 mg by mouth daily as needed.     No current facility-administered medications for this visit.    No Known Allergies  Social History   Socioeconomic History   Marital status: Married    Spouse name: Not on file   Number of children: Not on file   Years of education: Not on file   Highest education level: Not on file  Occupational History   Not on file  Tobacco Use   Smoking status: Some Days  Packs/day: 0.10    Years: 45.00    Total pack years: 4.50    Types: Cigarettes    Last attempt to quit: 10/24/2007    Years since quitting: 13.7   Smokeless tobacco: Never   Tobacco comments:    Has occasional cigarette  Vaping Use   Vaping Use: Never used  Substance and Sexual Activity   Alcohol use: Yes    Comment: 1 x per year   Drug use: Never   Sexual activity: Not on file  Other Topics Concern   Not on file  Social History Narrative   Not on file   Social Determinants of Health   Financial Resource Strain: Not on file  Food Insecurity: Not on file  Transportation Needs: Not on file  Physical Activity: Not on file  Stress: Not on file  Social Connections: Not on file  Intimate Partner Violence: Not on file    Family History  Problem Relation Age of Onset   Heart attack Mother     Heart attack Father    Heart disease Sister    Heart attack Sister    Coronary artery disease Other     Review of Systems:  As stated in the HPI and otherwise negative.   There were no vitals taken for this visit.  Physical Examination:  General: Well developed, well nourished, NAD  HEENT: OP clear, mucus membranes moist  SKIN: warm, dry. No rashes. Neuro: No focal deficits  Musculoskeletal: Muscle strength 5/5 all ext  Psychiatric: Mood and affect normal  Neck: No JVD, no carotid bruits, no thyromegaly, no lymphadenopathy.  Lungs:Clear bilaterally, no wheezes, rhonci, crackles Cardiovascular: Regular rate and rhythm. No murmurs, gallops or rubs. Abdomen:Soft. Bowel sounds present. Non-tender.  Extremities: No lower extremity edema. Pulses are 2 + in the bilateral DP/PT.  EKG:  EKG is *** ordered today. The ekg ordered today demonstrates  Recent Labs: No results found for requested labs within last 365 days.   Lipid Panel    Component Value Date/Time   CHOL 140 06/30/2018 0736   TRIG 121 06/30/2018 0736   HDL 41 06/30/2018 0736   CHOLHDL 3.4 06/30/2018 0736   CHOLHDL 3.7 02/03/2015 0752   VLDL 21 02/03/2015 0752   LDLCALC 75 06/30/2018 0736     Wt Readings from Last 3 Encounters:  06/05/21 204 lb 3.2 oz (92.6 kg)  05/09/20 199 lb (90.3 kg)  02/26/20 197 lb (89.4 kg)     Assessment and Plan:   1. CAD without angina: No chest pain suggestive of angina. Continue ASA, Plavix, beta blocker and statin. Echo now to assess LV function.      2. HYPERLIPIDEMIA: Lipids followed in primary care. LDL ***. Continue statin and Zetia.   3. PAD: ABI December 2016 stable, 0.93 bilaterally. He has had no symptoms c/w claudication. We discussed repeating his ABI but he does not wish to do this today.   4. AAA: Followed in [facility]. 4.4 cm in 2023.   Labs/ tests ordered today include:   No orders of the defined types were placed in this encounter.   Disposition:   F/U with me  in 12  months  Signed, Lauree Chandler, MD 08/04/2021 10:39 AM    [facility] [address], [place], [place]  [address] Phone: [phone]; Fax: [phone]

## 2021-08-05 ENCOUNTER — Ambulatory Visit (INDEPENDENT_AMBULATORY_CARE_PROVIDER_SITE_OTHER): Payer: Medicare Other | Admitting: Cardiovascular Disease

## 2021-08-05 ENCOUNTER — Encounter: Payer: Self-pay | Admitting: Cardiovascular Disease

## 2021-08-05 VITALS — BP 130/76 | HR 52 | Ht 66.5 in | Wt 203.6 lb

## 2021-08-05 DIAGNOSIS — I739 Peripheral vascular disease, unspecified: Secondary | ICD-10-CM | POA: Diagnosis not present

## 2021-08-05 DIAGNOSIS — I2510 Atherosclerotic heart disease of native coronary artery without angina pectoris: Secondary | ICD-10-CM | POA: Diagnosis not present

## 2021-08-05 DIAGNOSIS — E7800 Pure hypercholesterolemia, unspecified: Secondary | ICD-10-CM | POA: Diagnosis not present

## 2021-08-05 MED ORDER — ATORVASTATIN CALCIUM 80 MG PO TABS: ORAL_TABLET | ORAL | 3 refills | Status: DC

## 2021-08-05 MED ORDER — EZETIMIBE 10 MG PO TABS: 10.0000 mg | ORAL_TABLET | Freq: Every day | ORAL | 3 refills | Status: DC

## 2021-08-05 MED ORDER — CLOPIDOGREL BISULFATE 75 MG PO TABS: 75.0000 mg | ORAL_TABLET | Freq: Every day | ORAL | 3 refills | Status: DC

## 2021-08-05 MED ORDER — METOPROLOL TARTRATE 25 MG PO TABS: 25.0000 mg | ORAL_TABLET | Freq: Two times a day (BID) | ORAL | 3 refills | Status: DC

## 2021-08-05 MED ORDER — ENALAPRIL MALEATE 2.5 MG PO TABS: 2.5000 mg | ORAL_TABLET | Freq: Every day | ORAL | 3 refills | Status: DC

## 2021-08-05 NOTE — Patient Instructions (Signed)
Medication Instructions:  No changes *If you need a refill on your cardiac medications before your next appointment, please call your pharmacy*   Lab Work: none If you have labs (blood work) drawn today and your tests are completely normal, you will receive your results only by: [facility] (if you have MyChart) OR A paper copy in the mail If you have any lab test that is abnormal or we need to change your treatment, we will call you to review the results.   Testing/Procedures: Your physician has requested that you have an echocardiogram. Echocardiography is a painless test that uses sound waves to create images of your heart. It provides your doctor with information about the size and shape of your heart and how well your heart's chambers and valves are working. This procedure takes approximately one hour. There are no restrictions for this procedure.    Follow-Up: At [facility], you and your health needs are our priority.  As part of our continuing mission to provide you with exceptional heart care, we have created designated Provider Care Teams.  These Care Teams include your primary Cardiologist (physician) and Advanced Practice Providers (APPs -  Physician Assistants and Nurse Practitioners) who all work together to provide you with the care you need, when you need it.   Your next appointment:   12 month(s)  The format for your next appointment:   In Person  Provider:   Lauree Chandler, MD     Other Instructions   Important Information About Sugar

## 2021-08-24 ENCOUNTER — Ambulatory Visit (HOSPITAL_COMMUNITY): Payer: Medicare Other | Attending: Cardiovascular Disease

## 2021-08-24 DIAGNOSIS — I2510 Atherosclerotic heart disease of native coronary artery without angina pectoris: Secondary | ICD-10-CM | POA: Insufficient documentation

## 2021-08-24 LAB — ECHOCARDIOGRAM COMPLETE
Area-P 1/2: 3.19 cm2
S' Lateral: 3.2 cm

## 2022-05-06 DIAGNOSIS — Z125 Encounter for screening for malignant neoplasm of prostate: Secondary | ICD-10-CM | POA: Diagnosis not present

## 2022-05-06 DIAGNOSIS — I25119 Atherosclerotic heart disease of native coronary artery with unspecified angina pectoris: Secondary | ICD-10-CM | POA: Diagnosis not present

## 2022-05-06 DIAGNOSIS — J439 Emphysema, unspecified: Secondary | ICD-10-CM | POA: Diagnosis not present

## 2022-05-06 DIAGNOSIS — Z0000 Encounter for general adult medical examination without abnormal findings: Secondary | ICD-10-CM | POA: Diagnosis not present

## 2022-05-06 DIAGNOSIS — L821 Other seborrheic keratosis: Secondary | ICD-10-CM | POA: Diagnosis not present

## 2022-05-06 DIAGNOSIS — I714 Abdominal aortic aneurysm, without rupture, unspecified: Secondary | ICD-10-CM | POA: Diagnosis not present

## 2022-05-06 DIAGNOSIS — R7303 Prediabetes: Secondary | ICD-10-CM | POA: Diagnosis not present

## 2022-05-06 DIAGNOSIS — E7800 Pure hypercholesterolemia, unspecified: Secondary | ICD-10-CM | POA: Diagnosis not present

## 2022-05-06 DIAGNOSIS — G4733 Obstructive sleep apnea (adult) (pediatric): Secondary | ICD-10-CM | POA: Diagnosis not present

## 2022-05-06 DIAGNOSIS — Z1331 Encounter for screening for depression: Secondary | ICD-10-CM | POA: Diagnosis not present

## 2022-05-06 DIAGNOSIS — N529 Male erectile dysfunction, unspecified: Secondary | ICD-10-CM | POA: Diagnosis not present

## 2022-05-06 DIAGNOSIS — I700 Atherosclerosis of aorta: Secondary | ICD-10-CM | POA: Diagnosis not present

## 2022-05-06 DIAGNOSIS — Z122 Encounter for screening for malignant neoplasm of respiratory organs: Secondary | ICD-10-CM | POA: Diagnosis not present

## 2022-05-11 ENCOUNTER — Other Ambulatory Visit: Payer: Self-pay

## 2022-05-11 DIAGNOSIS — Z87891 Personal history of nicotine dependence: Secondary | ICD-10-CM

## 2022-05-11 DIAGNOSIS — F17210 Nicotine dependence, cigarettes, uncomplicated: Secondary | ICD-10-CM

## 2022-05-28 ENCOUNTER — Other Ambulatory Visit: Payer: Self-pay | Admitting: *Deleted

## 2022-05-28 DIAGNOSIS — I7143 Infrarenal abdominal aortic aneurysm, without rupture: Secondary | ICD-10-CM

## 2022-06-01 ENCOUNTER — Other Ambulatory Visit: Payer: Self-pay | Admitting: *Deleted

## 2022-06-01 DIAGNOSIS — I739 Peripheral vascular disease, unspecified: Secondary | ICD-10-CM

## 2022-06-04 ENCOUNTER — Telehealth: Payer: Self-pay | Admitting: *Deleted

## 2022-06-04 NOTE — Telephone Encounter (Signed)
   [facility] Pre-operative Risk Assessment    Patient Name: Keith [name]  DOB: March 06, 1950 MRN: [id number]    HEARTCARE STAFF:  - IMPORTANT!!!!!! Under Visit Info/Reason for Call, type in Other and utilize the format Clearance MM/DD/YY or Clearance TBD. Do not use dashes or single digits. - Please review there is not already an duplicate clearance open for this procedure. - If request is for dental extraction, please clarify the # of teeth to be extracted. - If the patient is currently at the dentist's office, call Pre-Op Callback Staff (MA/nurse) to input urgent request.  - If the patient is not currently in the dentist office, please route to the Pre-Op pool.  Request for surgical clearance:  What type of surgery is being performed? Colonoscopy   When is this surgery scheduled? 07/06/22  What type of clearance is required (medical clearance vs. Pharmacy clearance to hold med vs. Both)? Both  Are there any medications that need to be held prior to surgery and how long? Plavix 5 days prior. They are requesting okay to hold after if polyps are removed  Practice name and name of physician performing surgery? [facility], Dr Marca Ancona  What is the office phone number? [phone]   7.   What is the office fax number? [phone]  8.   Anesthesia type (None, local, MAC, general) ? Not listed   Keith [name] 06/04/2022, 3:54 PM  _________________________________________________________________   (provider comments below)

## 2022-06-04 NOTE — Telephone Encounter (Signed)
  Patient Consent for Virtual Visit        Keith [name] has provided verbal consent on 06/04/2022 for a virtual visit (video or telephone).   CONSENT FOR VIRTUAL VISIT FOR:  Keith [name]  By participating in this virtual visit I agree to the following:  I hereby voluntarily request, consent and authorize [facility] and its employed or contracted physicians, physician assistants, nurse practitioners or other licensed health care professionals (the Practitioner), to provide me with telemedicine health care services (the "Services") as deemed necessary by the treating Practitioner. I acknowledge and consent to receive the Services by the Practitioner via telemedicine. I understand that the telemedicine visit will involve communicating with the Practitioner through live audiovisual communication technology and the disclosure of certain medical information by electronic transmission. I acknowledge that I have been given the opportunity to request an in-person assessment or other available alternative prior to the telemedicine visit and am voluntarily participating in the telemedicine visit.  I understand that I have the right to withhold or withdraw my consent to the use of telemedicine in the course of my care at any time, without affecting my right to future care or treatment, and that the Practitioner or I may terminate the telemedicine visit at any time. I understand that I have the right to inspect all information obtained and/or recorded in the course of the telemedicine visit and may receive copies of available information for a reasonable fee.  I understand that some of the potential risks of receiving the Services via telemedicine include:  Delay or interruption in medical evaluation due to technological equipment failure or disruption; Information transmitted may not be sufficient (e.g. poor resolution of images) to allow for appropriate medical decision making by the  Practitioner; and/or  In rare instances, security protocols could fail, causing a breach of personal health information.  Furthermore, I acknowledge that it is my responsibility to provide information about my medical history, conditions and care that is complete and accurate to the best of my ability. I acknowledge that Practitioner's advice, recommendations, and/or decision may be based on factors not within their control, such as incomplete or inaccurate data provided by me or distortions of diagnostic images or specimens that may result from electronic transmissions. I understand that the practice of medicine is not an exact science and that Practitioner makes no warranties or guarantees regarding treatment outcomes. I acknowledge that a copy of this consent can be made available to me via my patient portal [facility] MyChart), or I can request a printed copy by calling the office of [facility].    I understand that my insurance will be billed for this visit.   I have read or had this consent read to me. I understand the contents of this consent, which adequately explains the benefits and risks of the Services being provided via telemedicine.  I have been provided ample opportunity to ask questions regarding this consent and the Services and have had my questions answered to my satisfaction. I give my informed consent for the services to be provided through the use of telemedicine in my medical care

## 2022-06-04 NOTE — Telephone Encounter (Signed)
   Name: Keith [name]. Bevens  DOB: 27-Apr-1950  MRN: [id number]  Primary Cardiologist: Verne Carrow, MD   Preoperative team, please contact this patient and set up a phone call appointment for further preoperative risk assessment. Please obtain consent and complete medication review. Thank you for your help.  I confirm that guidance regarding antiplatelet and oral anticoagulation therapy has been completed and, if necessary, noted below.  If needed his Plavix may be held for 5 days prior to his procedure.  Please resume as soon as hemostasis is achieved.  Ronney Asters, NP 06/04/2022, 4:18 PM [facility]

## 2022-06-04 NOTE — Telephone Encounter (Signed)
Spoke with patient and he agrees to schedule pre op telehealth visit. Scheduled him for 06/15/22, will route to requesting surgeons office to make them aware.

## 2022-06-09 ENCOUNTER — Other Ambulatory Visit: Payer: Self-pay | Admitting: Cardiovascular Disease

## 2022-06-09 ENCOUNTER — Ambulatory Visit
Admission: RE | Admit: 2022-06-09 | Discharge: 2022-06-09 | Disposition: A | Discharge status: Home / Self Care | Payer: Medicare Other | Source: Ambulatory Visit | Attending: Family Medicine | Admitting: Family Medicine

## 2022-06-09 DIAGNOSIS — Z87891 Personal history of nicotine dependence: Secondary | ICD-10-CM

## 2022-06-09 DIAGNOSIS — F17210 Nicotine dependence, cigarettes, uncomplicated: Secondary | ICD-10-CM

## 2022-06-10 ENCOUNTER — Other Ambulatory Visit: Payer: Self-pay | Admitting: Cardiovascular Disease

## 2022-06-11 ENCOUNTER — Ambulatory Visit (HOSPITAL_COMMUNITY)
Admission: RE | Admit: 2022-06-11 | Discharge: 2022-06-11 | Disposition: A | Discharge status: Home / Self Care | Payer: Medicare Other | Source: Ambulatory Visit | Attending: Vascular Surgery | Admitting: Vascular Surgery

## 2022-06-11 ENCOUNTER — Ambulatory Visit (INDEPENDENT_AMBULATORY_CARE_PROVIDER_SITE_OTHER): Payer: Medicare Other | Admitting: Physician Assistant

## 2022-06-11 ENCOUNTER — Ambulatory Visit (INDEPENDENT_AMBULATORY_CARE_PROVIDER_SITE_OTHER)
Admission: RE | Admit: 2022-06-11 | Discharge: 2022-06-11 | Disposition: A | Discharge status: Home / Self Care | Payer: Medicare Other | Source: Ambulatory Visit | Attending: Vascular Surgery | Admitting: Vascular Surgery

## 2022-06-11 VITALS — BP 151/86 | HR 48 | Temp 97.7°F | Resp 18 | Ht 66.0 in | Wt 200.9 lb

## 2022-06-11 DIAGNOSIS — I739 Peripheral vascular disease, unspecified: Secondary | ICD-10-CM

## 2022-06-11 DIAGNOSIS — I7143 Infrarenal abdominal aortic aneurysm, without rupture: Secondary | ICD-10-CM

## 2022-06-11 DIAGNOSIS — F17200 Nicotine dependence, unspecified, uncomplicated: Secondary | ICD-10-CM | POA: Diagnosis not present

## 2022-06-11 NOTE — Progress Notes (Signed)
HISTORY AND PHYSICAL     CC:  follow up. Requesting Provider:  Tally Joe, MD  HPI: This is a 72 y.o. male who is here today for follow up for AAA.    Pt was last seen 06/06/2022 by Dr. Karin Lieu and at that time, he was referred after AAA screening with AAA measuring 4.4cm.  he was not having any abdominal pain, claudication, rest pain or tissues loss.    The pt returns today for follow up.  He is not having any new abdominal or back pain.  He does have some back issues and arthritis that he feels causes him some leg cramping.  He does not have rest pain on non healing wounds.  He does continue to smoke.   He has hx of CAD with hx of MI x 2 and cardiac stenting.  The pt is on a statin for cholesterol management.    The pt is on an aspirin.    Other AC:  Plavix The pt is on BB, ACEI for hypertension.  The pt is not on medication diabetes. Tobacco hx:  current    Past Medical History:  Diagnosis Date   CAD (coronary artery disease)    CAD, NATIVE VESSEL    a. LHC in the setting of inferior STEMI in 10/09 demonstrating a mid RCA 99%, proximal LAD 70% and an EF of 60%. He underwent placement of a BMS to the RCA at that time;  b. Inf-Lat [id number] [facility]): s/p 3x22 Resolute DES to CFX, EF 50% with mild lat HK   GERD (gastroesophageal reflux disease)    Hx of echocardiogram    a. Echo 11/08/11: EF 55-60%, normal wall motion, normal diastolic function.    HYPERLIPIDEMIA-MIXED    Nephrolithiasis    PAD (peripheral artery disease) (HCC)     Past Surgical History:  Procedure Laterality Date   CORONARY STENT PLACEMENT     Lower Back Surgery     x 2    No Known Allergies  Current Outpatient Medications  Medication Sig Dispense Refill   aspirin 81 MG tablet Take 81 mg by mouth daily.     atorvastatin (LIPITOR) 80 MG tablet Take 1 tablet by mouth daily 90 tablet 3   clopidogrel (PLAVIX) 75 MG tablet Take 1 tablet (75 mg total) by mouth daily. 90 tablet 3   enalapril  (VASOTEC) 2.5 MG tablet Take 1 tablet (2.5 mg total) by mouth daily. 90 tablet 3   ezetimibe (ZETIA) 10 MG tablet Take 1 tablet (10 mg total) by mouth daily. 90 tablet 3   metoprolol tartrate (LOPRESSOR) 25 MG tablet Take 1 tablet (25 mg total) by mouth 2 (two) times daily. 180 tablet 3   nitroGLYCERIN (NITROSTAT) 0.4 MG SL tablet Place 1 tablet (0.4 mg total) under the tongue every 5 (five) minutes as needed for chest pain. 25 tablet 4   pantoprazole (PROTONIX) 40 MG tablet Take 40 mg by mouth daily.     sildenafil (REVATIO) 20 MG tablet Take 40-100 mg by mouth daily as needed.     No current facility-administered medications for this visit.    Family History  Problem Relation Age of Onset   Heart attack Mother    Heart attack Father    Heart disease Sister    Heart attack Sister    Coronary artery disease Other     Social History   Socioeconomic History   Marital status: Married    Spouse name: Not on file  Number of children: Not on file   Years of education: Not on file   Highest education level: Not on file  Occupational History   Not on file  Tobacco Use   Smoking status: Some Days    Packs/day: 0.10    Years: 45.00    Additional pack years: 0.00    Total pack years: 4.50    Types: Cigarettes    Last attempt to quit: 10/24/2007    Years since quitting: 14.6   Smokeless tobacco: Never   Tobacco comments:    Has occasional cigarette  Vaping Use   Vaping Use: Never used  Substance and Sexual Activity   Alcohol use: Yes    Comment: 1 x per year   Drug use: Never   Sexual activity: Not on file  Other Topics Concern   Not on file  Social History Narrative   Not on file   Social Determinants of Health   Financial Resource Strain: Not on file  Food Insecurity: Not on file  Transportation Needs: Not on file  Physical Activity: Not on file  Stress: Not on file  Social Connections: Not on file  Intimate Partner Violence: Not on file     REVIEW OF SYSTEMS:    [X]  denotes positive finding, [ ]  denotes negative finding Cardiac  Comments:  Chest pain or chest pressure:    Shortness of breath upon exertion:    Short of breath when lying flat:    Irregular heart rhythm:        Vascular    Pain in calf, thigh, or hip brought on by ambulation:    Pain in feet at night that wakes you up from your sleep:     Blood clot in your veins:    Leg swelling:         Pulmonary    Oxygen at home:    Productive cough:     Wheezing:         Neurologic    Sudden weakness in arms or legs:     Sudden numbness in arms or legs:     Sudden onset of difficulty speaking or slurred speech:    Temporary loss of vision in one eye:     Problems with dizziness:         Gastrointestinal    Blood in stool:     Vomited blood:         Genitourinary    Burning when urinating:     Blood in urine:        Psychiatric    Major depression:         Hematologic    Bleeding problems:    Problems with blood clotting too easily:        Skin    Rashes or ulcers:        Constitutional    Fever or chills:      PHYSICAL EXAMINATION:  Today's Vitals   06/11/22 0911  BP: (!) 151/86  Pulse: (!) 48  Resp: 18  Temp: 97.7 F (36.5 C)  TempSrc: Temporal  SpO2: 97%  Weight: 200 lb 14.4 oz (91.1 kg)  Height: 5\' 6"  (1.676 m)  PainSc: 0-No pain   Body mass index is 32.43 kg/m.   General:  WDWN in NAD; vital signs documented above Gait: Not observed HENT: WNL, normocephalic Pulmonary: normal non-labored breathing  Cardiac: regular HR, without carotid bruits Abdomen: soft, NT; aortic pulse is not palpable Skin: without rashes Vascular Exam/Pulses:  Right Left  Radial 2+ (normal) 2+ (normal)  Femoral 2+ (normal) 2+ (normal)  Popliteal Unable to palpate Unable to palpate  DP 2+ (normal) 2+ (normal)  PT Unable to palpate Unable to palpate   Extremities: without ischemic changes, without Gangrene , without cellulitis; without open wounds Musculoskeletal:  no muscle wasting or atrophy  Neurologic: A&O X 3;  No focal weakness or paresthesias are detected Psychiatric:  The pt has Normal affect.   Non-Invasive Vascular Imaging:   AAA and BLE  Arterial duplex on 06/11/2022: Abdominal Aorta Findings:  +-----------+-------+----------+----------+--------+--------+--------+  Location  AP (cm)Trans (cm)PSV (cm/s)WaveformThrombusComments  +-----------+-------+----------+----------+--------+--------+--------+  Proximal  2.46   2.62      45                                  +-----------+-------+----------+----------+--------+--------+--------+  Mid       3.54   2.99      50                                  +-----------+-------+----------+----------+--------+--------+--------+  Distal    4.17   4.25      61                                  +-----------+-------+----------+----------+--------+--------+--------+  RT CIA Prox1.1    1.0                                           +-----------+-------+----------+----------+--------+--------+--------+  LT CIA Prox0.9    1.2                                           +-----------+-------+----------+----------+--------+--------+--------+   Summary:  Right: No evidence of popliteal aneurysm  Left: No evidence of popliteal aneurysm.     ASSESSMENT/PLAN:: 72 y.o. male here for follow up for AAA  -AAA measures 4.3cm today and is essentially unchanged from last year.  He remains asymptomatic.  He knows that if he develops new sudden, severe abdominal or back pain, he is to call 911.   -he will continue his asa/statin/plavix -current smoker-discussed importance of smoking cessation especially in light of AAA and hx of MI.   -pt will f/u in one year with AAA duplex.   Doreatha Massed, [facility] [phone]  Clinic MD:   Karin Lieu

## 2022-06-13 NOTE — Telephone Encounter (Signed)
Plavix is generic. What is the cost? See refill request stating plavix is not covered by his insurance. chris

## 2022-06-14 NOTE — Telephone Encounter (Signed)
Called [facility]. They needed a new prescription for Plavix.  Coverage is not an issue. Refilled medication.

## 2022-06-15 ENCOUNTER — Ambulatory Visit: Payer: Medicare Other | Attending: Cardiovascular Disease

## 2022-06-15 ENCOUNTER — Other Ambulatory Visit: Payer: Self-pay | Admitting: Acute Care

## 2022-06-15 ENCOUNTER — Other Ambulatory Visit: Payer: Self-pay | Admitting: *Deleted

## 2022-06-15 DIAGNOSIS — Z87891 Personal history of nicotine dependence: Secondary | ICD-10-CM

## 2022-06-15 DIAGNOSIS — F17210 Nicotine dependence, cigarettes, uncomplicated: Secondary | ICD-10-CM

## 2022-06-15 DIAGNOSIS — Z01810 Encounter for preprocedural cardiovascular examination: Secondary | ICD-10-CM

## 2022-06-15 DIAGNOSIS — Z122 Encounter for screening for malignant neoplasm of respiratory organs: Secondary | ICD-10-CM

## 2022-06-15 MED ORDER — CLOPIDOGREL BISULFATE 75 MG PO TABS: 75.0000 mg | ORAL_TABLET | Freq: Every day | ORAL | 1 refills | Status: DC

## 2022-06-15 MED ORDER — ENALAPRIL MALEATE 2.5 MG PO TABS: 2.5000 mg | ORAL_TABLET | Freq: Every day | ORAL | 1 refills | Status: DC

## 2022-06-15 MED ORDER — METOPROLOL TARTRATE 25 MG PO TABS: 25.0000 mg | ORAL_TABLET | Freq: Two times a day (BID) | ORAL | 1 refills | Status: DC

## 2022-06-15 MED ORDER — EZETIMIBE 10 MG PO TABS: 10.0000 mg | ORAL_TABLET | Freq: Every day | ORAL | 1 refills | Status: DC

## 2022-06-15 NOTE — Progress Notes (Signed)
Virtual Visit via Telephone Note   Because of Keith [name]'s co-morbid illnesses, he is at least at moderate risk for complications without adequate follow up.  This format is felt to be most appropriate for this patient at this time.  The patient did not have access to video technology/had technical difficulties with video requiring transitioning to audio format only (telephone).  All issues noted in this document were discussed and addressed.  No physical exam could be performed with this format.  Please refer to the patient's chart for his consent to telehealth for [facility].  Evaluation Performed:  Preoperative cardiovascular risk assessment _____________   Date:  06/15/2022   Patient ID:  Keith [name], DOB 1950/09/10, MRN [id number] Patient Location:  Home Provider location:   Office  Primary Care Provider:  Tally Joe, MD Primary Cardiologist:  Keith Carrow, MD  Chief Complaint / Patient Profile   72 y.o. y/o male with a h/o CAD, HLD, AAA and PAD who is pending colonoscopy and presents today for telephonic preoperative cardiovascular risk assessment.  History of Present Illness    Keith [name] is a 72 y.o. male who presents via Web designer for a telehealth visit today.  Pt was last seen in cardiology clinic on 08/05/2021 by Dr. Clifton [name].  At that time Keith [name] was doing well .  The patient is now pending procedure as outlined above. Since his last visit, he tells me that he is not having any chest pains or shortness of breath.  We discussed continuing aspirin while he is holding his Plavix for his colonoscopy.  He did score higher than the 4 METS on the DASI.  He can do some walking but is not very active.  He is a big fisherman and enjoys that.  Per office protocol, is okay to hold Plavix x 5 days prior to his procedure.  Please continue aspirin 81 mg throughout.  Please resume Plavix when medically safe to do  so.  Past Medical History    Past Medical History:  Diagnosis Date   CAD (coronary artery disease)    CAD, NATIVE VESSEL    a. LHC in the setting of inferior STEMI in 10/09 demonstrating a mid RCA 99%, proximal LAD 70% and an EF of 60%. He underwent placement of a BMS to the RCA at that time;  b. Inf-Lat [id number] [facility]): s/p 3x22 Resolute DES to CFX, EF 50% with mild lat HK   GERD (gastroesophageal reflux disease)    Hx of echocardiogram    a. Echo 11/08/11: EF 55-60%, normal wall motion, normal diastolic function.    HYPERLIPIDEMIA-MIXED    Nephrolithiasis    PAD (peripheral artery disease) (HCC)    Past Surgical History:  Procedure Laterality Date   CORONARY STENT PLACEMENT     Lower Back Surgery     x 2    Allergies  No Known Allergies  Home Medications    Prior to Admission medications   Medication Sig Start Date End Date Taking? Authorizing Provider  aspirin 81 MG tablet Take 81 mg by mouth daily.    [provider]  atorvastatin (LIPITOR) 80 MG tablet Take 1 tablet by mouth daily 08/05/21   Keith Hazel, MD  clopidogrel (PLAVIX) 75 MG tablet TAKE 1 TABLET BY MOUTH DAILY 06/14/22   Keith Hazel, MD  enalapril (VASOTEC) 2.5 MG tablet Take 1 tablet (2.5 mg total) by mouth daily. 08/05/21   Keith Hazel, MD  ezetimibe (ZETIA) 10 MG tablet Take 1 tablet (10 mg total) by mouth daily. 08/05/21   Keith Hazel, MD  metoprolol tartrate (LOPRESSOR) 25 MG tablet Take 1 tablet (25 mg total) by mouth 2 (two) times daily. 08/05/21   Keith Hazel, MD  nitroGLYCERIN (NITROSTAT) 0.4 MG SL tablet Place 1 tablet (0.4 mg total) under the tongue every 5 (five) minutes as needed for chest pain. 09/15/20   Keith Hazel, MD  pantoprazole (PROTONIX) 40 MG tablet Take 40 mg by mouth daily. 02/05/20   [provider]  sildenafil (REVATIO) 20 MG tablet Take 40-100 mg by mouth daily as needed. 03/19/21   [provider]    Physical Exam    Vital Signs:  Keith [name] does not have vital signs available for review today.  Given telephonic nature of communication, physical exam is limited. AAOx3. NAD. Normal affect.  Speech and respirations are unlabored.  Accessory Clinical Findings    None  Assessment & Plan    1.  Preoperative Cardiovascular Risk Assessment:  Keith [name]'s perioperative risk of a major cardiac event is 0.9% according to the Revised Cardiac Risk Index (RCRI).  Therefore, he is at low risk for perioperative complications.   His functional capacity is good at 5.62 METs according to the [facility] Activity Status Index (DASI). Recommendations: According to ACC/AHA guidelines, no further cardiovascular testing needed.  The patient may proceed to surgery at acceptable risk.   Antiplatelet and/or Anticoagulation Recommendations: The patient should remain on Aspirin without interruption.   Clopidogrel (Plavix) can be held for 5 days prior to his surgery and resumed as soon as possible post op.   The patient was advised that if he develops new symptoms prior to surgery to contact our office to arrange for a follow-up visit, and he verbalized understanding.   A copy of this note will be routed to requesting surgeon.  Time:   Today, I have spent 7 minutes with the patient with telehealth technology discussing medical history, symptoms, and management plan.    Sharlene Dory, PA-C  06/15/2022, 9:44 AM

## 2022-06-15 NOTE — Telephone Encounter (Signed)
Called patient at request of Jari Favre, PA-C.  Pt was requested refills and to schedule his next appointment with Dr. Clifton Emmitt.   Refilled medications and scheduled his next appointment.

## 2022-06-15 NOTE — Addendum Note (Signed)
Addended by: Lendon Ka on: 06/15/2022 10:32 AM   Modules accepted: Orders

## 2022-06-23 ENCOUNTER — Other Ambulatory Visit: Payer: Self-pay

## 2022-06-23 DIAGNOSIS — I7143 Infrarenal abdominal aortic aneurysm, without rupture: Secondary | ICD-10-CM

## 2022-07-06 DIAGNOSIS — Z09 Encounter for follow-up examination after completed treatment for conditions other than malignant neoplasm: Secondary | ICD-10-CM | POA: Diagnosis not present

## 2022-07-06 DIAGNOSIS — D123 Benign neoplasm of transverse colon: Secondary | ICD-10-CM | POA: Diagnosis not present

## 2022-07-06 DIAGNOSIS — Z86010 Personal history of colonic polyps: Secondary | ICD-10-CM | POA: Diagnosis not present

## 2022-07-06 DIAGNOSIS — K5730 Diverticulosis of large intestine without perforation or abscess without bleeding: Secondary | ICD-10-CM | POA: Diagnosis not present

## 2022-07-13 DIAGNOSIS — D123 Benign neoplasm of transverse colon: Secondary | ICD-10-CM | POA: Diagnosis not present

## 2022-07-29 IMAGING — CT CT CHEST LUNG CANCER SCREENING LOW DOSE W/O CM
1 series · 10 of 10 positions shown, 13 images · non-contrast
Comparison: None.

CLINICAL DATA: 69-year-old asymptomatic male former smoker with 115
pack-year smoking history.

EXAM:
CT CHEST WITHOUT CONTRAST LOW-DOSE FOR LUNG CANCER SCREENING
TECHNIQUE: Multidetector CT imaging of the chest was performed following the
standard protocol without IV contrast.

[ct lung segmentation data · axial · 0.86mm/px · z∈[-349,-349]mm · 10 of 344 frames shown]
[frame 1/344  mediastinal]
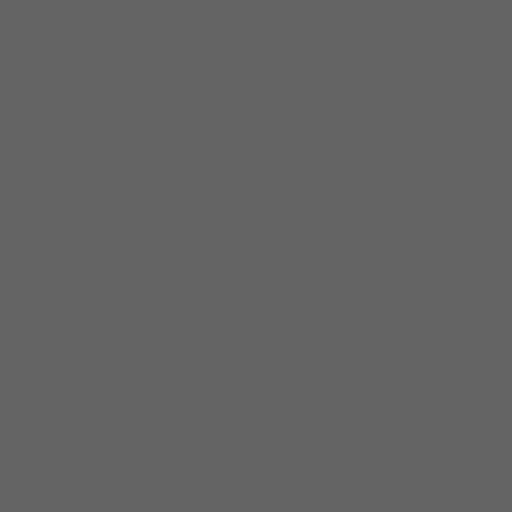
[frame 1/344  lung]
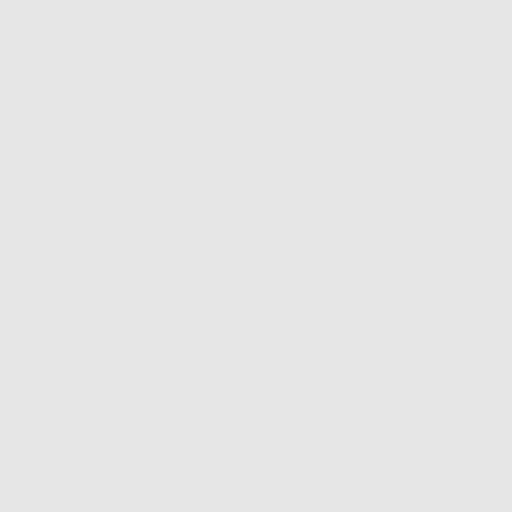
[frame 39/344  lung]
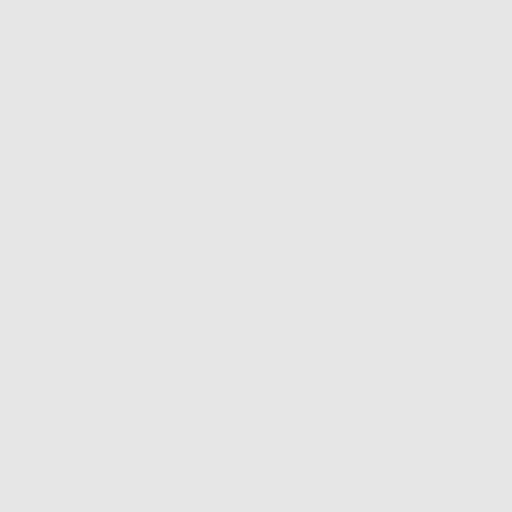
[frame 77/344  lung]
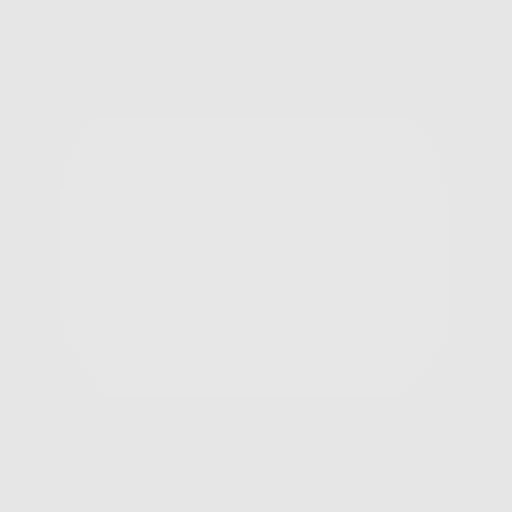
[frame 115/344  lung]
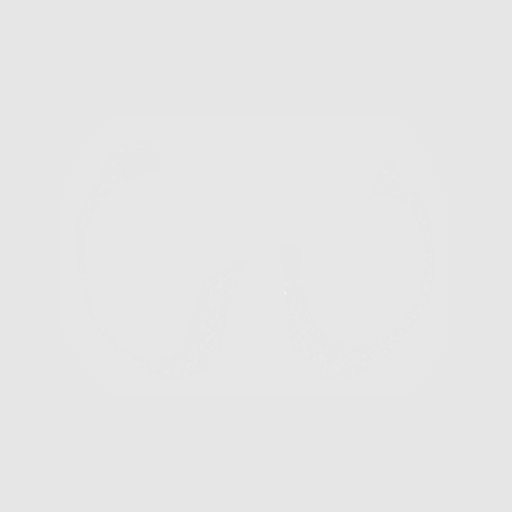
[frame 153/344  mediastinal]
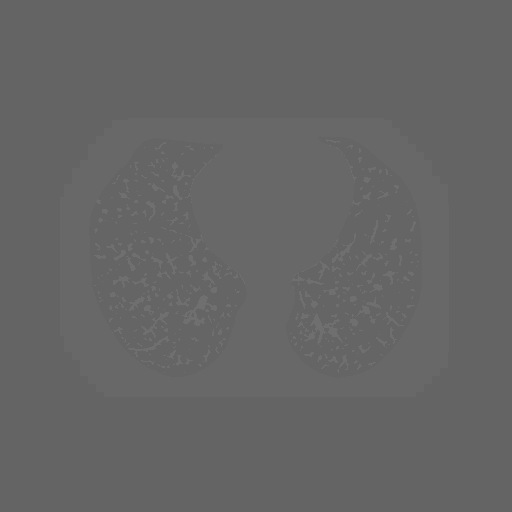
[frame 153/344  lung]
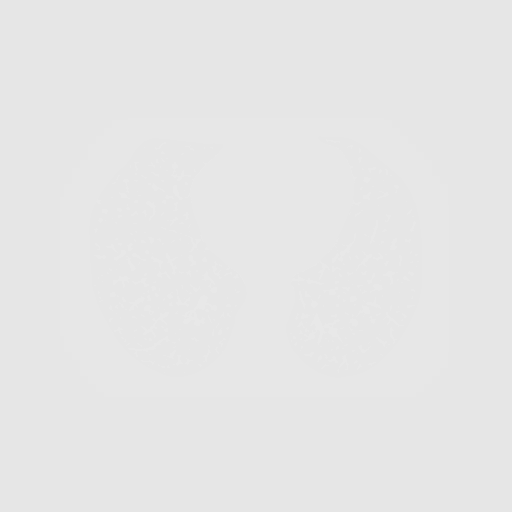
[frame 191/344  lung]
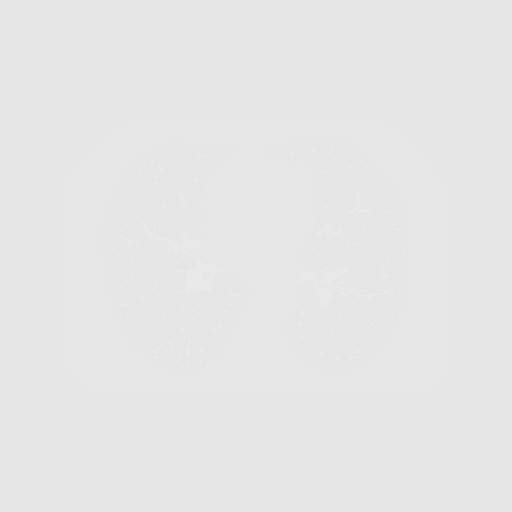
[frame 229/344  lung]
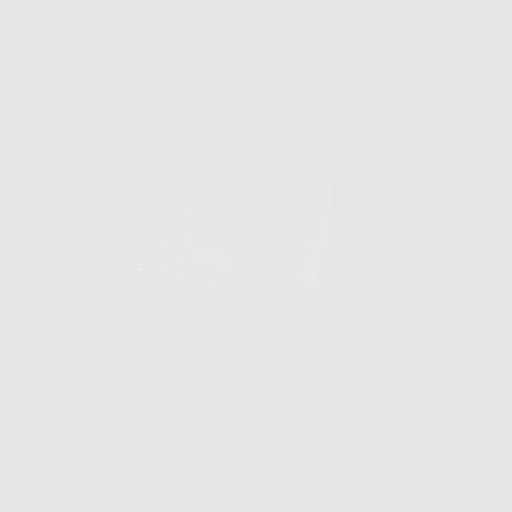
[frame 267/344  lung]
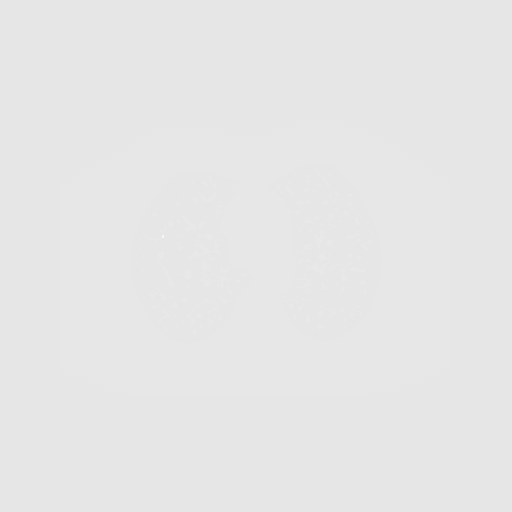
[frame 305/344  mediastinal]
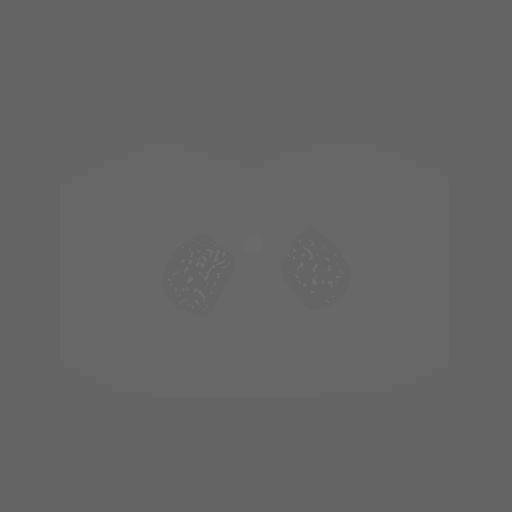
[frame 305/344  lung]
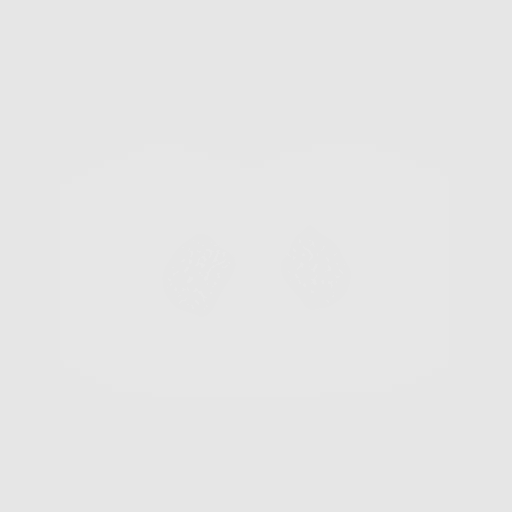
[frame 344/344  lung]
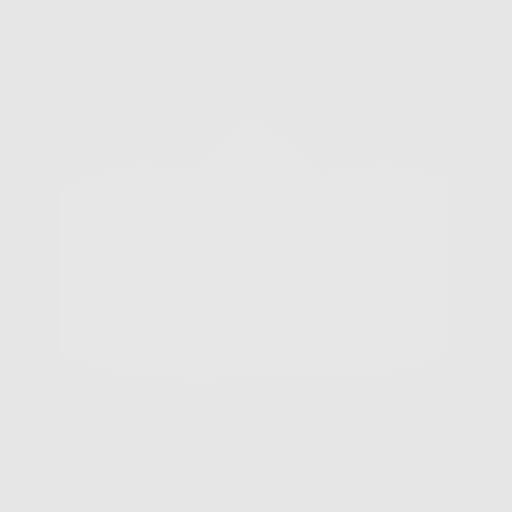

[10 of 10 positions shown; findings below may reference images not displayed]

FINDINGS: Cardiovascular: Normal heart size. No significant pericardial
effusion/thickening. Three-vessel coronary atherosclerosis.
Atherosclerotic nonaneurysmal thoracic aorta. Normal caliber
pulmonary arteries.

Mediastinum/Nodes: No discrete thyroid nodules. Unremarkable
esophagus. No pathologically enlarged axillary, mediastinal or hilar
lymph nodes, noting limited sensitivity for the detection of hilar
adenopathy on this noncontrast study.

Lungs/Pleura: No pneumothorax. No pleural effusion. Mild
centrilobular emphysema with diffuse bronchial wall thickening. No
acute consolidative airspace disease or lung masses. A few tiny
solid bilateral pulmonary nodules, largest 3.0 mm in volume derived
mean diameter in the anterior left lower lobe (series 3/image 207).

Upper abdomen: No acute abnormality.

Musculoskeletal: No aggressive appearing focal osseous lesions. Mild
thoracic spondylosis.
IMPRESSION: 1. Lung-RADS 2, benign appearance or behavior. Continue annual
screening with low-dose chest CT without contrast in 12 months.
2. Three-vessel coronary atherosclerosis.
3. Aortic Atherosclerosis (2DL6P-7WO.O) and Emphysema (2DL6P-5MI.2).

## 2022-08-02 DIAGNOSIS — G4733 Obstructive sleep apnea (adult) (pediatric): Secondary | ICD-10-CM | POA: Diagnosis not present

## 2022-09-16 DIAGNOSIS — E1169 Type 2 diabetes mellitus with other specified complication: Secondary | ICD-10-CM | POA: Diagnosis not present

## 2022-09-16 DIAGNOSIS — Z23 Encounter for immunization: Secondary | ICD-10-CM | POA: Diagnosis not present

## 2022-09-16 DIAGNOSIS — E7800 Pure hypercholesterolemia, unspecified: Secondary | ICD-10-CM | POA: Diagnosis not present

## 2022-09-16 DIAGNOSIS — N529 Male erectile dysfunction, unspecified: Secondary | ICD-10-CM | POA: Diagnosis not present

## 2022-09-16 DIAGNOSIS — I25119 Atherosclerotic heart disease of native coronary artery with unspecified angina pectoris: Secondary | ICD-10-CM | POA: Diagnosis not present

## 2022-09-16 DIAGNOSIS — I700 Atherosclerosis of aorta: Secondary | ICD-10-CM | POA: Diagnosis not present

## 2022-09-16 DIAGNOSIS — I714 Abdominal aortic aneurysm, without rupture, unspecified: Secondary | ICD-10-CM | POA: Diagnosis not present

## 2022-09-16 DIAGNOSIS — G4733 Obstructive sleep apnea (adult) (pediatric): Secondary | ICD-10-CM | POA: Diagnosis not present

## 2022-09-16 DIAGNOSIS — J439 Emphysema, unspecified: Secondary | ICD-10-CM | POA: Diagnosis not present

## 2022-10-01 DIAGNOSIS — S59912A Unspecified injury of left forearm, initial encounter: Secondary | ICD-10-CM | POA: Diagnosis not present

## 2022-10-01 DIAGNOSIS — M65232 Calcific tendinitis, left forearm: Secondary | ICD-10-CM | POA: Diagnosis not present

## 2022-10-01 DIAGNOSIS — M79632 Pain in left forearm: Secondary | ICD-10-CM | POA: Diagnosis not present

## 2022-10-10 NOTE — Progress Notes (Signed)
Chief Complaint  Patient presents with   Follow-up    CAD   History of Present Illness: 72 yo male with history of CAD, HLD, AAA and PAD here today for cardiac follow up.  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 His ejection fraction was 60% at that time. He is a former smoker. He has not smoked since 2009. He was admitted to [facility] in [place] in November 2013 with an acute inferolateral STEMI secondary to an occluded left Circumflex treated with a 3.0 x22 Resolute Integrity DES. His EF was 50%. RCA stent patent with mild restenosis. LAD with mild 25% plaque mid. Echo November 2013 with LVEF=55-60%. Exercise stress January 2016 without ischemia. He is known to have PAD with last ABI in December 2016 with bilateral ABI 0.93.  He has been found to have a AAA and is now followed in the [facility] by Dr. Sherral Hammers. Echo August 2023 with LVEF=65-70%. No valve disease.   He is here today for follow up. The patient denies any chest pain, dyspnea, palpitations, lower extremity edema, orthopnea, PND, dizziness, near syncope or syncope.    Primary Care Physician: Tally Joe, MD  Past Medical History:  Diagnosis Date   CAD (coronary artery disease)    CAD, NATIVE VESSEL    a. LHC in the setting of inferior STEMI in 10/09 demonstrating a mid RCA 99%, proximal LAD 70% and an EF of 60%. He underwent placement of a BMS to the RCA at that time;  b. Inf-Lat [id number] [facility]): s/p 3x22 Resolute DES to CFX, EF 50% with mild lat HK   GERD (gastroesophageal reflux disease)    Hx of echocardiogram    a. Echo 11/08/11: EF 55-60%, normal wall motion, normal diastolic function.    HYPERLIPIDEMIA-MIXED    Nephrolithiasis    PAD (peripheral artery disease) (HCC)     Past Surgical History:  Procedure Laterality Date   CORONARY STENT PLACEMENT     Lower Back Surgery     x 2    Current Outpatient  Medications  Medication Sig Dispense Refill   aspirin 81 MG tablet Take 81 mg by mouth daily.     atorvastatin (LIPITOR) 80 MG tablet Take 1 tablet by mouth daily 90 tablet 3   clopidogrel (PLAVIX) 75 MG tablet Take 1 tablet (75 mg total) by mouth daily. 90 tablet 1   enalapril (VASOTEC) 2.5 MG tablet Take 1 tablet (2.5 mg total) by mouth daily. 90 tablet 1   ezetimibe (ZETIA) 10 MG tablet Take 1 tablet (10 mg total) by mouth daily. 90 tablet 1   metoprolol tartrate (LOPRESSOR) 25 MG tablet Take 1 tablet (25 mg total) by mouth 2 (two) times daily. 180 tablet 1   nitroGLYCERIN (NITROSTAT) 0.4 MG SL tablet Place 1 tablet (0.4 mg total) under the tongue every 5 (five) minutes as needed for chest pain. 25 tablet 4   pantoprazole (PROTONIX) 40 MG tablet Take 40 mg by mouth daily.     sildenafil (REVATIO) 20 MG tablet Take 40-100 mg by mouth daily as needed.     No current facility-administered medications for this visit.    No Known Allergies  Social History   Socioeconomic History   Marital status: Married    Spouse name: Not on file   Number of children: Not on file   Years of education: Not on file  Highest education level: Not on file  Occupational History   Not on file  Tobacco Use   Smoking status: Some Days    Current packs/day: 0.00    Average packs/day: 0.1 packs/day for 45.0 years (4.5 ttl pk-yrs)    Types: Cigarettes    Start date: 10/24/1962    Last attempt to quit: 10/24/2007    Years since quitting: 14.9   Smokeless tobacco: Never   Tobacco comments:    Has occasional cigarette  Vaping Use   Vaping status: Never Used  Substance and Sexual Activity   Alcohol use: Yes    Comment: 1 x per year   Drug use: Never   Sexual activity: Not on file  Other Topics Concern   Not on file  Social History Narrative   Not on file   Social Determinants of Health   Financial Resource Strain: Not on file  Food Insecurity: Not on file  Transportation Needs: Not on file   Physical Activity: Not on file  Stress: Not on file  Social Connections: Not on file  Intimate Partner Violence: Not on file    Family History  Problem Relation Age of Onset   Heart attack Mother    Heart attack Father    Heart disease Sister    Heart attack Sister    Coronary artery disease Other     Review of Systems:  As stated in the HPI and otherwise negative.   BP (!) 162/82   Pulse 73   Ht 5\' 6"  (1.676 m)   Wt 88.3 kg   SpO2 95%   BMI 31.41 kg/m   Physical Examination:  General: Well developed, well nourished, NAD  HEENT: OP clear, mucus membranes moist  SKIN: warm, dry. No rashes. Neuro: No focal deficits  Musculoskeletal: Muscle strength 5/5 all ext  Psychiatric: Mood and affect normal  Neck: No JVD, no carotid bruits, no thyromegaly, no lymphadenopathy.  Lungs:Clear bilaterally, no wheezes, rhonci, crackles Cardiovascular: Regular rate and rhythm. No murmurs, gallops or rubs. Abdomen:Soft. Bowel sounds present. Non-tender.  Extremities: No lower extremity edema. Pulses are 2 + in the bilateral DP/PT.  EKG:  EKG is ordered today. The ekg ordered today demonstrates EKG Interpretation Date/Time:  Monday October 11 2022 09:19:50 EDT Ventricular Rate:  73 PR Interval:  154 QRS Duration:  78 QT Interval:  362 QTC Calculation: 398 R Axis:   30  Text Interpretation: Normal sinus rhythm T wave abnormality, consider inferior ischemia When compared with ECG of 25-Oct-2007 07:14, Criteria for Inferior infarct are no longer Present Nonspecific T wave abnormality no longer evident in Lateral leads Confirmed by Verne Carrow [phone]) on 10/11/2022 9:25:38 AM    Recent Labs: No results found for requested labs within last 365 days.   Lipid Panel    Component Value Date/Time   CHOL 140 06/30/2018 0736   TRIG 121 06/30/2018 0736   HDL 41 06/30/2018 0736   CHOLHDL 3.4 06/30/2018 0736   CHOLHDL 3.7 02/03/2015 0752   VLDL 21 02/03/2015 0752   LDLCALC 75  06/30/2018 0736     Wt Readings from Last 3 Encounters:  10/11/22 88.3 kg  06/11/22 91.1 kg  08/05/21 92.4 kg     Assessment and Plan:   1. CAD without angina: No chest pain suggestive of angina. LV function normal by echo in 2023. Continue ASA, Plavix, statin and beta blocker.       2. HYPERLIPIDEMIA: Lipids followed in primary care. UEA54 in may 2024. Continue statin  and Zetia.   3. PAD: ABI December 2016 stable, 0.93 bilaterally. He has had no symptoms c/w claudication. We discussed repeating his ABI but he does not wish to do this today.   4. AAA: Followed in [facility]  5. HTN: BP is controlled at home. He has not taken his meds yet today. Continue current therapy  Labs/ tests ordered today include:   Orders Placed This Encounter  Procedures   EKG 12-Lead   Disposition:   F/U with me in 12  months  Signed, Verne Carrow, MD 10/11/2022 9:54 AM    [facility] [address] [place], [place], [place]  [address] Phone: [phone]; Fax: [phone]

## 2022-10-11 ENCOUNTER — Encounter: Payer: Self-pay | Admitting: Cardiovascular Disease

## 2022-10-11 ENCOUNTER — Ambulatory Visit: Payer: Medicare Other | Attending: Cardiovascular Disease | Admitting: Cardiovascular Disease

## 2022-10-11 VITALS — BP 162/82 | HR 73 | Ht 66.0 in | Wt 194.6 lb

## 2022-10-11 DIAGNOSIS — I7143 Infrarenal abdominal aortic aneurysm, without rupture: Secondary | ICD-10-CM | POA: Insufficient documentation

## 2022-10-11 DIAGNOSIS — Z09 Encounter for follow-up examination after completed treatment for conditions other than malignant neoplasm: Secondary | ICD-10-CM | POA: Diagnosis not present

## 2022-10-11 DIAGNOSIS — E7800 Pure hypercholesterolemia, unspecified: Secondary | ICD-10-CM | POA: Insufficient documentation

## 2022-10-11 DIAGNOSIS — I2510 Atherosclerotic heart disease of native coronary artery without angina pectoris: Secondary | ICD-10-CM | POA: Insufficient documentation

## 2022-10-11 DIAGNOSIS — I739 Peripheral vascular disease, unspecified: Secondary | ICD-10-CM | POA: Diagnosis not present

## 2022-10-11 NOTE — Patient Instructions (Signed)
Medication Instructions:  No changes *If you need a refill on your cardiac medications before your next appointment, please call your pharmacy*   Lab Work: none   Testing/Procedures: none   Follow-Up: At [facility], you and your health needs are our priority.  As part of our continuing mission to provide you with exceptional heart care, we have created designated Provider Care Teams.  These Care Teams include your primary Cardiologist (physician) and Advanced Practice Providers (APPs -  Physician Assistants and Nurse Practitioners) who all work together to provide you with the care you need, when you need it.   Your next appointment:   12 month(s)  Provider:   Christopher McAlhany, MD   

## 2022-11-02 ENCOUNTER — Other Ambulatory Visit: Payer: Self-pay | Admitting: Cardiovascular Disease

## 2022-11-25 ENCOUNTER — Other Ambulatory Visit: Payer: Self-pay | Admitting: Cardiovascular Disease

## 2022-12-13 ENCOUNTER — Telehealth: Payer: Self-pay | Admitting: Cardiovascular Disease

## 2022-12-13 MED ORDER — ATORVASTATIN CALCIUM 80 MG PO TABS: ORAL_TABLET | ORAL | 3 refills | Status: DC

## 2022-12-13 NOTE — Telephone Encounter (Signed)
*  STAT* If patient is at the pharmacy, call can be transferred to refill team.   1. Which medications need to be refilled? (please list name of each medication and dose if known)   atorvastatin (LIPITOR) 80 MG tablet   2. Would you like to learn more about the convenience, safety, & potential cost savings by using the [facility]?   3. Are you open to using the [facility] (Type [facility]. ).  4. Which pharmacy/location (including street and city if local pharmacy) is medication to be sent to?  [facility]) - [place], [place] - [address] [place]   5. Do they need a 30 day or 90 day supply?   90 day  Patient stated he has 7 tablets left.

## 2023-06-14 DIAGNOSIS — Z23 Encounter for immunization: Secondary | ICD-10-CM | POA: Diagnosis not present

## 2023-06-14 DIAGNOSIS — M179 Osteoarthritis of knee, unspecified: Secondary | ICD-10-CM | POA: Diagnosis not present

## 2023-06-14 DIAGNOSIS — M791 Myalgia, unspecified site: Secondary | ICD-10-CM | POA: Diagnosis not present

## 2023-06-14 DIAGNOSIS — E7800 Pure hypercholesterolemia, unspecified: Secondary | ICD-10-CM | POA: Diagnosis not present

## 2023-06-14 DIAGNOSIS — Z125 Encounter for screening for malignant neoplasm of prostate: Secondary | ICD-10-CM | POA: Diagnosis not present

## 2023-06-14 DIAGNOSIS — Z1331 Encounter for screening for depression: Secondary | ICD-10-CM | POA: Diagnosis not present

## 2023-06-14 DIAGNOSIS — J439 Emphysema, unspecified: Secondary | ICD-10-CM | POA: Diagnosis not present

## 2023-06-14 DIAGNOSIS — I25119 Atherosclerotic heart disease of native coronary artery with unspecified angina pectoris: Secondary | ICD-10-CM | POA: Diagnosis not present

## 2023-06-14 DIAGNOSIS — I714 Abdominal aortic aneurysm, without rupture, unspecified: Secondary | ICD-10-CM | POA: Diagnosis not present

## 2023-06-14 DIAGNOSIS — E1169 Type 2 diabetes mellitus with other specified complication: Secondary | ICD-10-CM | POA: Diagnosis not present

## 2023-06-14 DIAGNOSIS — M79639 Pain in unspecified forearm: Secondary | ICD-10-CM | POA: Diagnosis not present

## 2023-06-14 DIAGNOSIS — Z0000 Encounter for general adult medical examination without abnormal findings: Secondary | ICD-10-CM | POA: Diagnosis not present

## 2023-06-16 ENCOUNTER — Ambulatory Visit

## 2023-06-16 ENCOUNTER — Other Ambulatory Visit (HOSPITAL_COMMUNITY)

## 2023-06-20 ENCOUNTER — Telehealth: Payer: Self-pay | Admitting: Acute Care

## 2023-06-20 NOTE — Telephone Encounter (Signed)
 Patient is due for annual LDCT. Called pt and left VM.

## 2023-06-29 ENCOUNTER — Other Ambulatory Visit: Payer: Self-pay

## 2023-06-29 DIAGNOSIS — F17210 Nicotine dependence, cigarettes, uncomplicated: Secondary | ICD-10-CM

## 2023-06-29 DIAGNOSIS — Z87891 Personal history of nicotine dependence: Secondary | ICD-10-CM

## 2023-07-01 ENCOUNTER — Other Ambulatory Visit: Payer: Self-pay

## 2023-07-01 DIAGNOSIS — I714 Abdominal aortic aneurysm, without rupture, unspecified: Secondary | ICD-10-CM

## 2023-07-01 IMAGING — US US ABDOMINAL AORTA SCREENING AAA
1 series · 14 of 25 positions shown · non-contrast
Comparison: CT abdomen pelvis 07/01/2010

CLINICAL DATA: Medicare screening

EXAM:
US ABDOMINAL AORTA MEDICARE SCREENING
TECHNIQUE: Ultrasound examination of the abdominal aorta was performed as a
screening evaluation for abdominal aortic aneurysm.

[Series 1: us abdominal aorta screening aaa · 0.26mm/px · 14 of 25 slices shown]
[im 1/25]
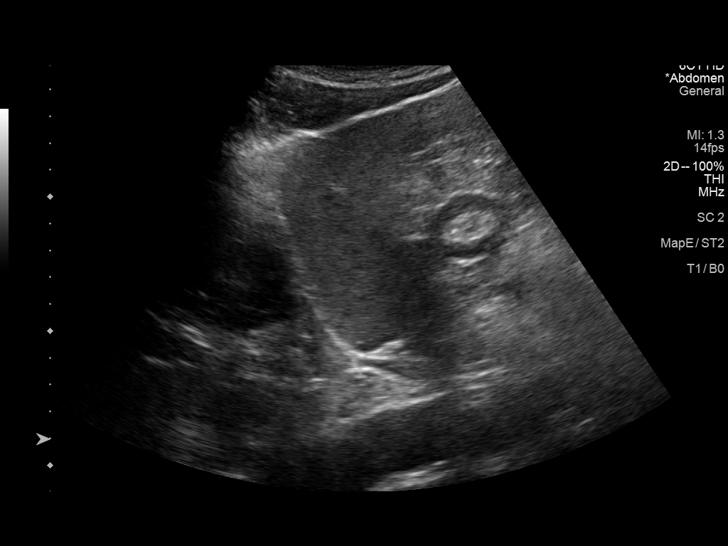
[im 3/25]
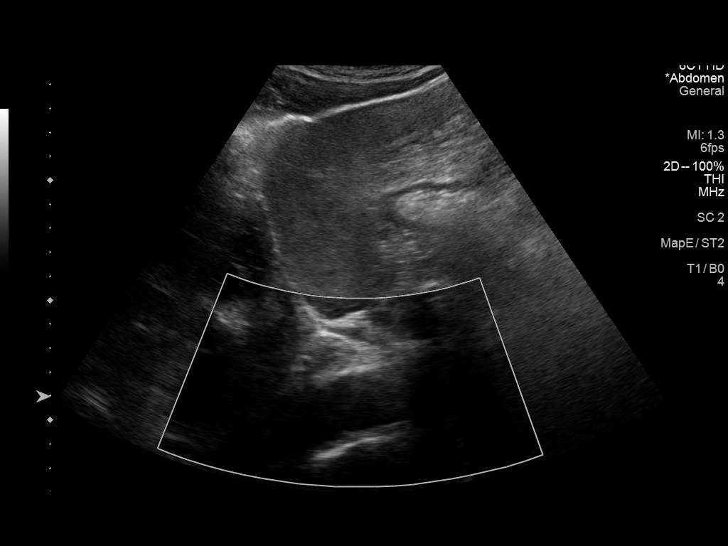
[im 5/25]
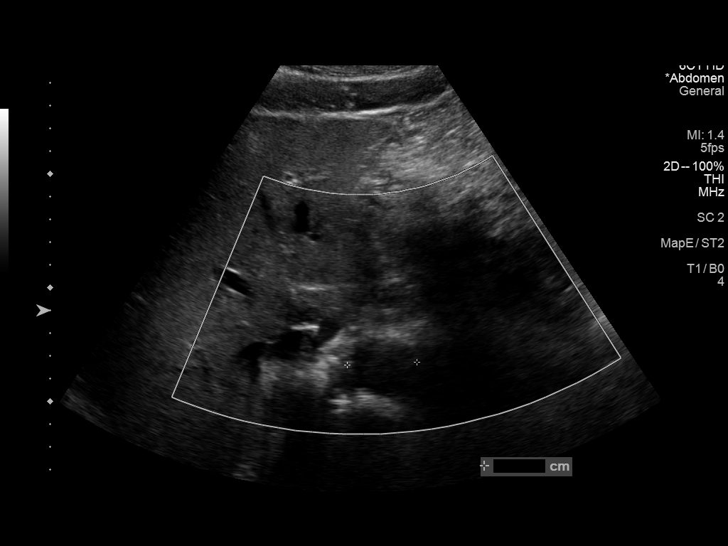
[im 7/25]
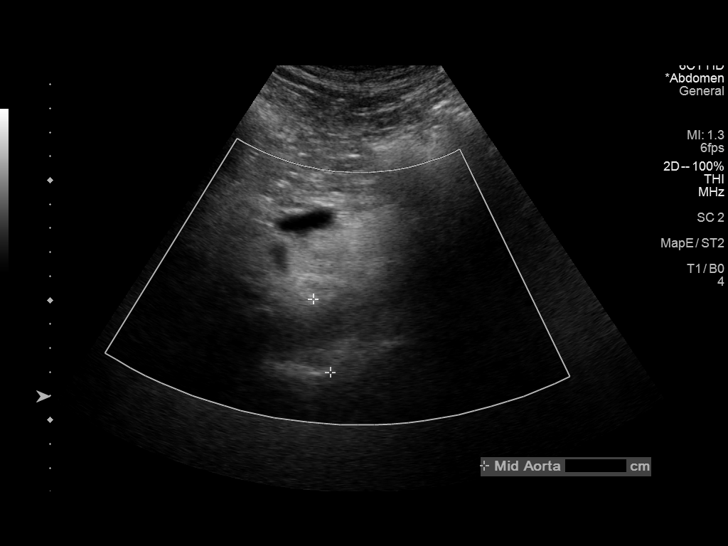
[im 9/25]
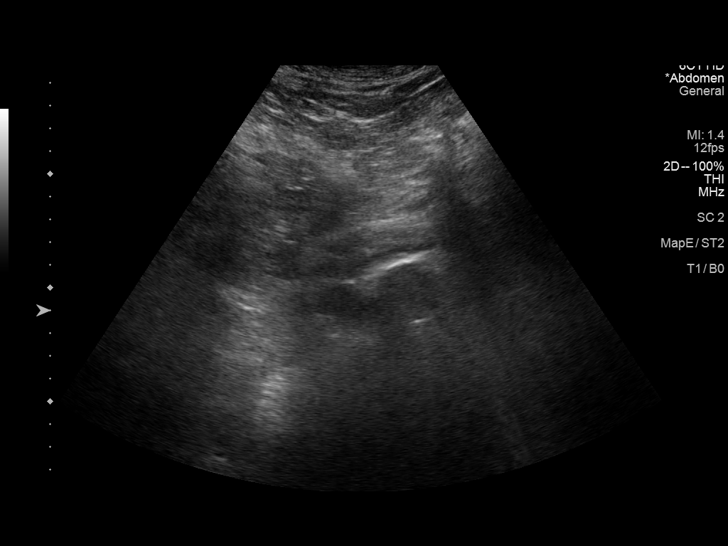
[im 10/25]
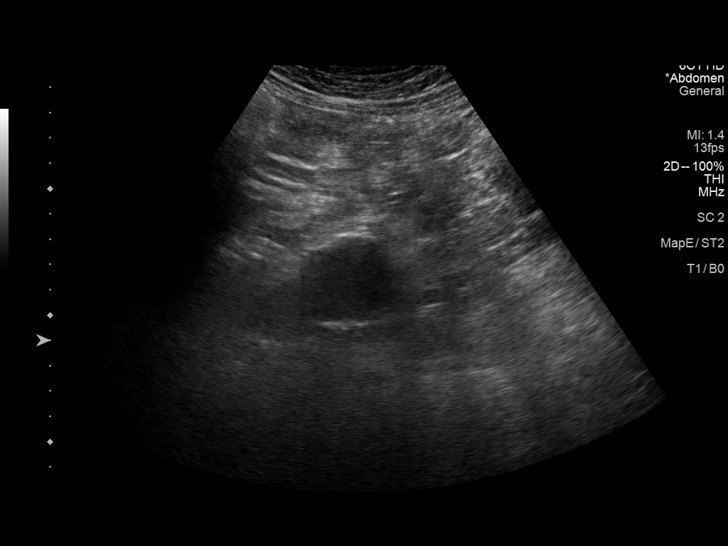
[im 12/25]
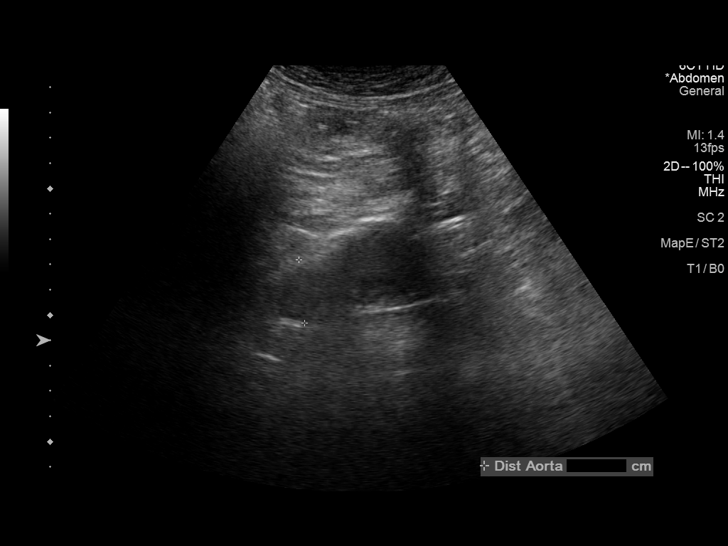
[im 14/25]
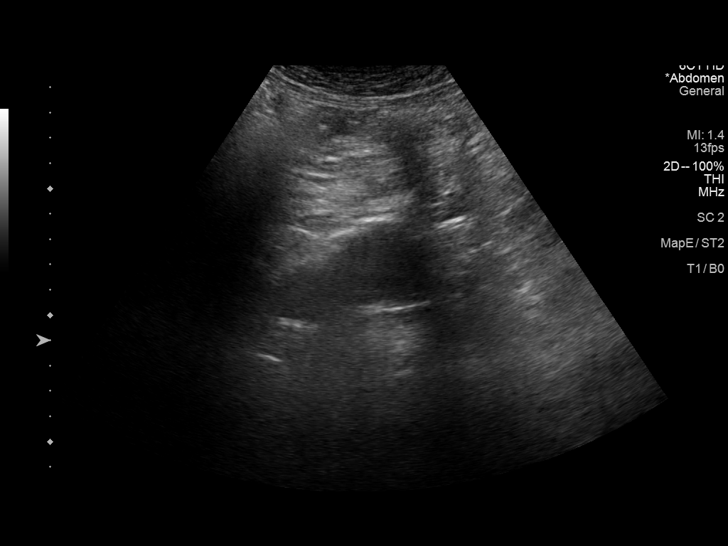
[im 16/25]
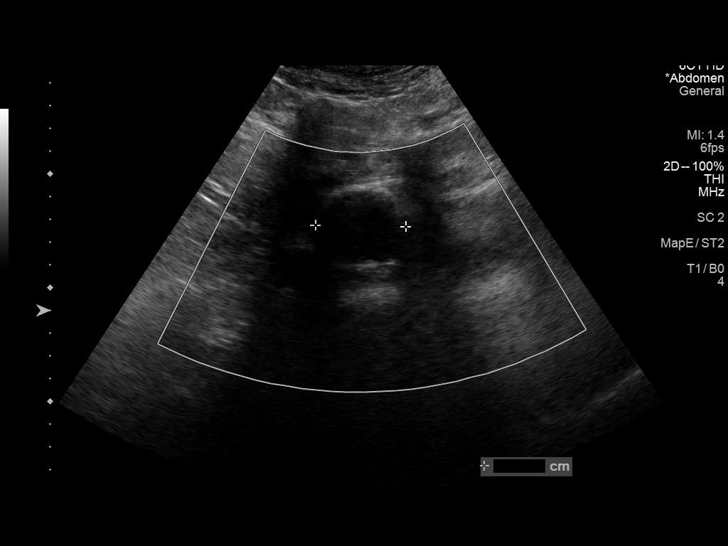
[im 17/25]
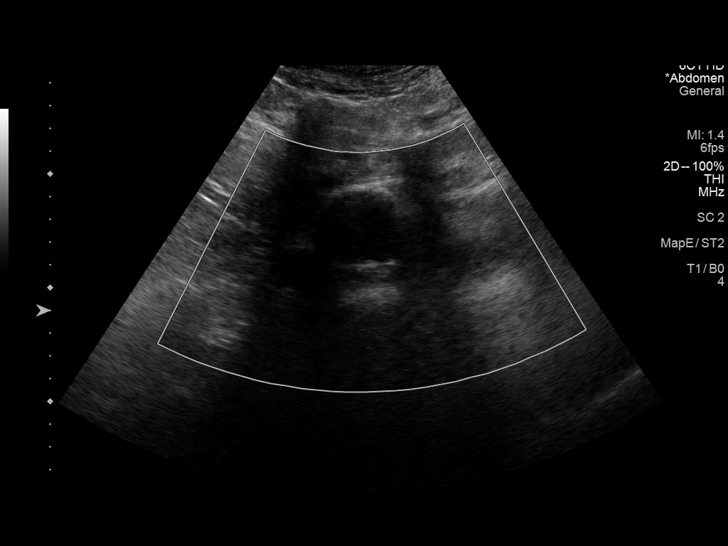
[im 19/25]
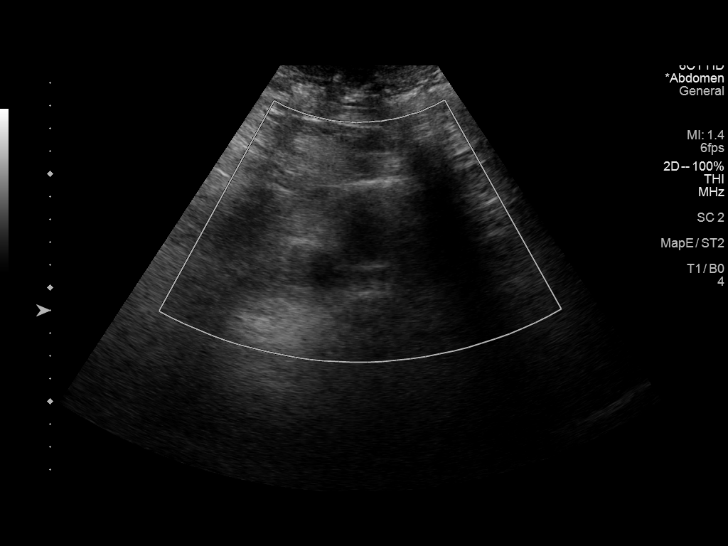
[im 21/25]
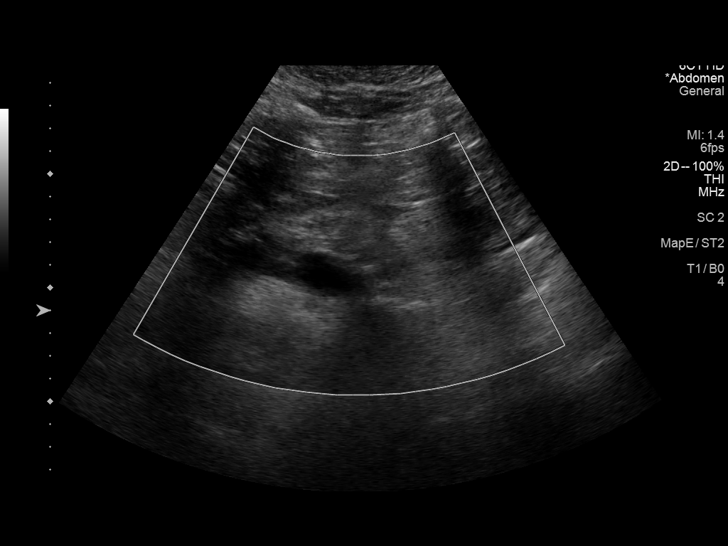
[im 23/25]
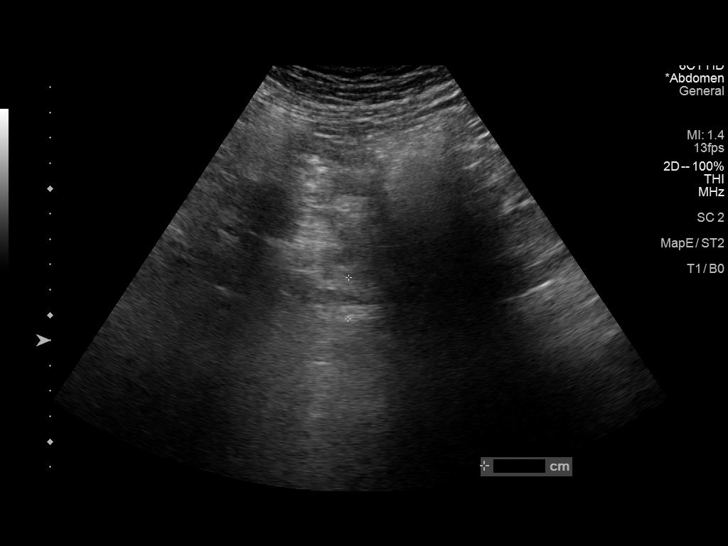
[im 25/25]
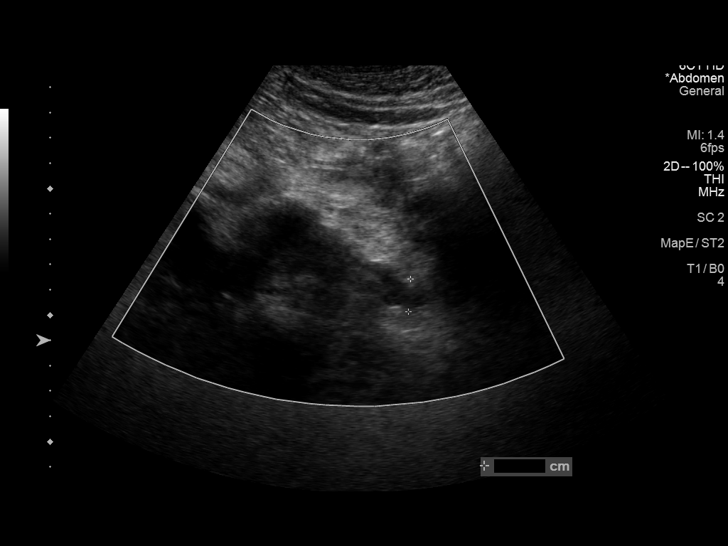

[14 of 25 positions shown; findings below may reference images not displayed]

FINDINGS: Abdominal aortic measurements as follows:

Proximal:  3.2 x 3.1 cm

Mid:  3.1 x 3.1 cm

Distal:  4.4 x 4.0 cm

Aortic atherosclerosis is noted.
IMPRESSION: Infrarenal abdominal aortic aneurysm, measuring up to 4.4 cm.
Recommend follow-up every 12 months and vascular consultation. This
recommendation follows ACR consensus guidelines: White Paper of the
ACR Incidental Findings Committee II on Vascular Findings. [HOSPITAL] 3525; [DATE].

## 2023-07-05 ENCOUNTER — Ambulatory Visit
Admission: RE | Admit: 2023-07-05 | Discharge: 2023-07-05 | Disposition: A | Discharge status: Home / Self Care | Source: Ambulatory Visit | Attending: Acute Care | Admitting: Acute Care

## 2023-07-05 DIAGNOSIS — F17210 Nicotine dependence, cigarettes, uncomplicated: Secondary | ICD-10-CM

## 2023-07-05 DIAGNOSIS — Z87891 Personal history of nicotine dependence: Secondary | ICD-10-CM

## 2023-07-05 DIAGNOSIS — Z122 Encounter for screening for malignant neoplasm of respiratory organs: Secondary | ICD-10-CM | POA: Diagnosis not present

## 2023-07-14 ENCOUNTER — Ambulatory Visit (HOSPITAL_COMMUNITY)
Admission: RE | Admit: 2023-07-14 | Discharge: 2023-07-14 | Disposition: A | Discharge status: Home / Self Care | Source: Ambulatory Visit | Attending: Vascular Surgery | Admitting: Vascular Surgery

## 2023-07-14 ENCOUNTER — Ambulatory Visit

## 2023-07-14 VITALS — BP 145/82 | HR 54 | Temp 98.0°F | Ht 66.0 in | Wt 201.9 lb

## 2023-07-14 DIAGNOSIS — I714 Abdominal aortic aneurysm, without rupture, unspecified: Secondary | ICD-10-CM | POA: Diagnosis not present

## 2023-07-14 DIAGNOSIS — F17200 Nicotine dependence, unspecified, uncomplicated: Secondary | ICD-10-CM

## 2023-07-14 NOTE — Progress Notes (Signed)
 Office Note     CC:  follow up Requesting Provider:  Seabron Lenis, MD  HPI: Keith [name]. Keith [name] is a 73 y.o. (1950-12-01) male who presents for surveillance of AAA.  Last office visit 1 year ago AAA measured 4.3 cm.  He denies any new or changing abdominal or back pain.  He is ambulatory without any claudication symptoms.  He is on aspirin, Plavix , statin daily.  He smokes cigarettes some days but not every day.   Past Medical History:  Diagnosis Date   CAD (coronary artery disease)    CAD, NATIVE VESSEL    a. LHC in the setting of inferior STEMI in 10/09 demonstrating a mid RCA 99%, proximal LAD 70% and an EF of 60%. He underwent placement of a BMS to the RCA at that time;  b. Inf-Lat [id number] [facility]): s/p 3x22 Resolute DES to CFX, EF 50% with mild lat HK   GERD (gastroesophageal reflux disease)    Hx of echocardiogram    a. Echo 11/08/11: EF 55-60%, normal wall motion, normal diastolic function.    HYPERLIPIDEMIA-MIXED    Nephrolithiasis    PAD (peripheral artery disease) (HCC)     Past Surgical History:  Procedure Laterality Date   CORONARY STENT PLACEMENT     Lower Back Surgery     x 2    Social History   Socioeconomic History   Marital status: Married    Spouse name: Not on file   Number of children: Not on file   Years of education: Not on file   Highest education level: Not on file  Occupational History   Not on file  Tobacco Use   Smoking status: Some Days    Current packs/day: 0.00    Average packs/day: 0.1 packs/day for 45.0 years (4.5 ttl pk-yrs)    Types: Cigarettes    Start date: 10/24/1962    Last attempt to quit: 10/24/2007    Years since quitting: 15.7   Smokeless tobacco: Never   Tobacco comments:    Has occasional cigarette  Vaping Use   Vaping status: Never Used  Substance and Sexual Activity   Alcohol use: Yes    Comment: 1 x per year   Drug use: Never   Sexual activity: Not on file  Other Topics Concern   Not on file   Social History Narrative   Not on file   Social Drivers of Health   Financial Resource Strain: Not on file  Food Insecurity: Not on file  Transportation Needs: Not on file  Physical Activity: Not on file  Stress: Not on file  Social Connections: Not on file  Intimate Partner Violence: Not on file    Family History  Problem Relation Age of Onset   Heart attack Mother    Heart attack Father    Heart disease Sister    Heart attack Sister    Coronary artery disease Other     Current Outpatient Medications  Medication Sig Dispense Refill   aspirin 81 MG tablet Take 81 mg by mouth daily.     atorvastatin  (LIPITOR) 80 MG tablet Take 1 tablet by mouth daily 90 tablet 3   clopidogrel  (PLAVIX ) 75 MG tablet TAKE 1 TABLET BY MOUTH DAILY 90 tablet 3   enalapril  (VASOTEC ) 2.5 MG tablet TAKE 1 TABLET BY MOUTH DAILY 90 tablet 2   ezetimibe  (ZETIA ) 10 MG tablet TAKE 1 TABLET BY MOUTH DAILY 90 tablet 2   metoprolol  tartrate (LOPRESSOR ) 25 MG tablet TAKE  1 TABLET BY MOUTH TWICE  DAILY 180 tablet 2   nitroGLYCERIN  (NITROSTAT ) 0.4 MG SL tablet Place 1 tablet (0.4 mg total) under the tongue every 5 (five) minutes as needed for chest pain. 25 tablet 4   pantoprazole (PROTONIX) 40 MG tablet Take 40 mg by mouth daily.     sildenafil (REVATIO) 20 MG tablet Take 40-100 mg by mouth daily as needed.     No current facility-administered medications for this visit.    No Known Allergies   REVIEW OF SYSTEMS:   [X]  denotes positive finding, [ ]  denotes negative finding Cardiac  Comments:  Chest pain or chest pressure:    Shortness of breath upon exertion:    Short of breath when lying flat:    Irregular heart rhythm:        Vascular    Pain in calf, thigh, or hip brought on by ambulation:    Pain in feet at night that wakes you up from your sleep:     Blood clot in your veins:    Leg swelling:         Pulmonary    Oxygen at home:    Productive cough:     Wheezing:         Neurologic     Sudden weakness in arms or legs:     Sudden numbness in arms or legs:     Sudden onset of difficulty speaking or slurred speech:    Temporary loss of vision in one eye:     Problems with dizziness:         Gastrointestinal    Blood in stool:     Vomited blood:         Genitourinary    Burning when urinating:     Blood in urine:        Psychiatric    Major depression:         Hematologic    Bleeding problems:    Problems with blood clotting too easily:        Skin    Rashes or ulcers:        Constitutional    Fever or chills:      PHYSICAL EXAMINATION:  Vitals:   07/14/23 0957  BP: (!) 145/82  Pulse: (!) 54  Temp: 98 F (36.7 C)  TempSrc: Temporal  SpO2: 95%  Weight: 201 lb 14.4 oz (91.6 kg)  Height: 5' 6 (1.676 m)    General:  WDWN in NAD; vital signs documented above Gait: Not observed HENT: WNL, normocephalic Pulmonary: normal non-labored breathing Cardiac: regular HR Abdomen: soft, NT, no masses Skin: without rashes Vascular Exam/Pulses: absent pedal pulses Extremities: without ischemic changes, without Gangrene , without cellulitis; without open wounds;  Musculoskeletal: no muscle wasting or atrophy  Neurologic: A&O X 3 Psychiatric:  The pt has Normal affect.   Non-Invasive Vascular Imaging:   4.4cm AAA at largest diameter    ASSESSMENT/PLAN:: 73 y.o. male here for follow up for surveillance of AAA  AAA is stable measuring 4.4 cm at largest diameter today by duplex.  A year ago largest diameter was essentially the same at 4.3 cm.  No indication for intervention at this time.  We will continue surveillance annually.  I encouraged smoking cessation.  He will continue his aspirin, Plavix , statin daily.   Donnice Sender, PA-C [facility] [phone]  Clinic MD:   [place]

## 2023-07-18 ENCOUNTER — Other Ambulatory Visit: Payer: Self-pay | Admitting: Acute Care

## 2023-07-18 DIAGNOSIS — F17210 Nicotine dependence, cigarettes, uncomplicated: Secondary | ICD-10-CM

## 2023-07-18 DIAGNOSIS — Z87891 Personal history of nicotine dependence: Secondary | ICD-10-CM

## 2023-07-18 DIAGNOSIS — Z122 Encounter for screening for malignant neoplasm of respiratory organs: Secondary | ICD-10-CM

## 2023-07-27 ENCOUNTER — Other Ambulatory Visit: Payer: Self-pay | Admitting: Cardiovascular Disease

## 2023-08-03 ENCOUNTER — Telehealth: Payer: Self-pay | Admitting: Cardiovascular Disease

## 2023-08-03 NOTE — Telephone Encounter (Signed)
*  STAT* If patient is at the pharmacy, call can be transferred to refill team.   1. Which medications need to be refilled? (please list name of each medication and dose if known)    atorvastatin  (LIPITOR) 80 MG tablet Take 1 tablet by mouth daily    clopidogrel  (PLAVIX ) 75 MG tablet TAKE 1 TABLET BY MOUTH DAILY     4. Which pharmacy/location (including street and city if local pharmacy) is medication to be sent to?  [facility] - [place], [place] - [address]       5. Do they need a 30 day or 90 day supply? 90

## 2023-08-08 DIAGNOSIS — G4733 Obstructive sleep apnea (adult) (pediatric): Secondary | ICD-10-CM | POA: Diagnosis not present

## 2023-10-08 ENCOUNTER — Other Ambulatory Visit: Payer: Self-pay | Admitting: Cardiovascular Disease

## 2023-10-12 ENCOUNTER — Ambulatory Visit: Attending: Cardiology | Admitting: Emergency Medicine

## 2023-10-12 ENCOUNTER — Encounter: Payer: Self-pay | Admitting: Emergency Medicine

## 2023-10-12 VITALS — BP 138/72 | HR 51 | Ht 66.0 in | Wt 199.0 lb

## 2023-10-12 DIAGNOSIS — I2510 Atherosclerotic heart disease of native coronary artery without angina pectoris: Secondary | ICD-10-CM | POA: Diagnosis not present

## 2023-10-12 DIAGNOSIS — Z720 Tobacco use: Secondary | ICD-10-CM | POA: Diagnosis not present

## 2023-10-12 DIAGNOSIS — I7143 Infrarenal abdominal aortic aneurysm, without rupture: Secondary | ICD-10-CM | POA: Diagnosis not present

## 2023-10-12 DIAGNOSIS — G4733 Obstructive sleep apnea (adult) (pediatric): Secondary | ICD-10-CM | POA: Insufficient documentation

## 2023-10-12 DIAGNOSIS — I739 Peripheral vascular disease, unspecified: Secondary | ICD-10-CM | POA: Diagnosis not present

## 2023-10-12 DIAGNOSIS — I10 Essential (primary) hypertension: Secondary | ICD-10-CM | POA: Diagnosis not present

## 2023-10-12 DIAGNOSIS — E785 Hyperlipidemia, unspecified: Secondary | ICD-10-CM | POA: Diagnosis not present

## 2023-10-12 DIAGNOSIS — I714 Abdominal aortic aneurysm, without rupture, unspecified: Secondary | ICD-10-CM | POA: Diagnosis not present

## 2023-10-12 NOTE — Patient Instructions (Addendum)
 Medication Instructions:  Your physician recommends that you continue on your current medications as directed. Please refer to the Current Medication list given to you today.   Lab Work: None ordered  Testing/Procedures: None ordered  Follow-Up: At [facility], you and your health needs are our priority.  As part of our continuing mission to provide you with exceptional heart care, our providers are all part of one team.  This team includes your primary Cardiologist (physician) and Advanced Practice Providers or APPs (Physician Assistants and Nurse Practitioners) who all work together to provide you with the care you need, when you need it.  Your next appointment:   1 YEAR  Provider:   Lonni Cash, MD OR Lum Louis, NP   Other Instructions:

## 2023-10-12 NOTE — Progress Notes (Signed)
 Cardiology Office Note:    Date:  10/12/2023  ID:  Keith [name], DOB 04/18/1950, MRN [id number] PCP: Seabron Lenis, MD  [facility] Providers Cardiologist:  Lonni Cash, MD Cardiology APP:  Rana Lum CROME, NP       Patient Profile:       Chief Complaint: 1 year follow-up History of Present Illness:  Keith [name] is a 73 y.o. male with visit-pertinent history of coronary artery disease, hyperlipidemia, hypertension, AAA, obstructive sleep apnea, tobacco use, and PAD  In 2009 he had an inferior MI treated with a bare-metal stent in the right coronary artery.  His ejection fraction was 60% at that time.  He is a former smoker ever quit smoking in 2009.  He was admitted in [place] in November 2013 with an acute inferolateral STEMI secondary to an occluded left circumflex treated with DES. RCA stent patent with mild restenosis.  LAD with mild 25% plaque.  He is EF was 50% at that time.  Echocardiogram November 2013 with LVEF 55 to 60%.  Exercise stress January 2016 without ischemia.  He is known to have PAD with ABI in December 2016 with bilateral ABIs at 0.93.  He has also been found to have AAA is now followed by [facility].  Echocardiogram 2023 with LVEF 65 to 70% with no valvular disease.  He was last seen in clinic on 10/11/2022 by Dr. Cash.  He was without any chest pain suggestive of angina.  He is continuing on aspirin, Plavix , statin, and beta-blocker.  Blood pressure is well-controlled.  He is to follow-up in 1 year.   Discussed the use of AI scribe software for clinical note transcription with the patient, who gave verbal consent to proceed.  History of Present Illness Keith [name] is a 73 year old male who presents for 1 month follow-up.  He has no new or acute cardiovascular issues since his last visit.  He denies any chest pains or dyspnea.  He maintains a fairly active lifestyle without any exertional symptoms.  He remains adherent to his  current medication regimen.  He does not monitor his blood pressure at home.  Denies any orthopnea, PND, LEE, syncope, presyncope, palpitations, lightheadedness, dizziness, hematochezia, or melena.  He remains active by fishing weekly and performing household tasks, although he has stopped hunting due to physical limitations.  He smokes approximately 2 cigarettes a month.  Reports he does get pretty significant secondhand smoke as his wife smokes daily.  Review of systems:  Please see the history of present illness. All other systems are reviewed and otherwise negative.      Studies Reviewed:    EKG Interpretation Date/Time:  Wednesday October 12 2023 10:45:06 EDT Ventricular Rate:  53 PR Interval:  156 QRS Duration:  72 QT Interval:  392 QTC Calculation: 367 R Axis:   7  Text Interpretation: Sinus bradycardia Nonspecific T wave abnormality When compared with ECG of 11-Oct-2022 09:19, T wave inversion no longer evident in Inferior leads No significant change was found Confirmed by Rana Lum [phone]) on 10/12/2023 12:28:17 PM    VAS US  AAA 07/14/2023 Abdominal Aorta: There is evidence of abnormal dilatation of the mid and  distal Abdominal aorta. There is evidence of abnormal dilation of the  Right Common Iliac artery and Left Common Iliac artery. The largest aortic  measurement is 4.4 cm. The largest  aortic diameter remains essentially unchanged compared to prior exam.  Previous diameter measurement was 4.3 cm obtained on 06/11/2022.  IVC/Iliac: Patent IVC.   Echocardiogram 08/24/2021  1. Left ventricular ejection fraction, by estimation, is 65 to 70%. The  left ventricle has normal function. The left ventricle has no regional  wall motion abnormalities. Left ventricular diastolic parameters were  normal.   2. Right ventricular systolic function is normal. The right ventricular  size is normal. Tricuspid regurgitation signal is inadequate for assessing  PA pressure.   3.  The mitral valve is grossly normal. Trivial mitral valve  regurgitation. No evidence of mitral stenosis.   4. The aortic valve is tricuspid. Aortic valve regurgitation is not  visualized. No aortic stenosis is present.   5. The inferior vena cava is normal in size with greater than 50%  respiratory variability, suggesting right atrial pressure of 3 mmHg.    Risk Assessment/Calculations:              Physical Exam:   VS:  BP 138/72   Pulse (!) 51   Ht 5' 6 (1.676 m)   Wt 199 lb (90.3 kg)   SpO2 93%   BMI 32.12 kg/m    Wt Readings from Last 3 Encounters:  10/12/23 199 lb (90.3 kg)  07/14/23 201 lb 14.4 oz (91.6 kg)  10/11/22 194 lb 9.6 oz (88.3 kg)    GEN: Well nourished, well developed in no acute distress NECK: No JVD; No carotid bruits CARDIAC: RRR, no murmurs, rubs, gallops RESPIRATORY:  Clear to auscultation without rales, wheezing or rhonchi  ABDOMEN: Soft, non-tender, non-distended EXTREMITIES:  No edema; No acute deformity      Assessment and Plan:  Coronary artery disease S/p inferior MI in 2009 with BMS to RCA S/p inferolateral MI in 2013 with DES to occluded LCx also showing patent RCA stent with mild restenosis -Today patient remains without chest pains.  He maintains an active lifestyle frequently fishing without any exertional symptoms.  No symptoms to suggest active angina.  No indication for ischemic evaluation at this time - Continue aspirin 81 mg daily and clopidogrel  75 mg daily - Continue atorvastatin  80 mg daily, ezetimibe  10 mg daily, metoprolol  tartrate 25 mg twice daily  Hyperlipidemia His lipids have primarily been followed by primary care LDL 78 on 06/2023 and 73 on 05/2022 He is not interested in injectable therapy - Recommended he continue with heart healthy dieting to reach goal - Continue atorvastatin  80 mg daily and ezetimibe  10 mg  Peripheral arterial disease His last ABIs in 12/2014 were stable and 0.93 bilaterally - Today he denies any  claudication - He is not interested in repeat ABIs for routine monitoring  AAA VAS US  duplex 07/2023 showed aortic diameter of 4.4 cm essentially unchanged compared to prior measurement of 4.3 cm obtained 06/2022 - Maintain adequate BP control - Advised him to stop smoking - Management per [facility]  Hypertension Blood pressure today is 138/72 - Continue enalapril  2.5 mg daily and metoprolol  tartrate 25 mg twice daily  Obstructive sleep apnea - He remains adherent to CPAP therapy  Tobacco use Smokes around 2 cigarettes a month - Tobacco cessation advised      Dispo:  Return in about 1 year (around 10/11/2024).  Signed, Lum LITTIE Louis, NP

## 2023-10-18 ENCOUNTER — Telehealth: Payer: Self-pay | Admitting: Emergency Medicine

## 2023-10-18 MED ORDER — CLOPIDOGREL BISULFATE 75 MG PO TABS: 75.0000 mg | ORAL_TABLET | Freq: Every day | ORAL | 3 refills | Status: AC

## 2023-10-18 MED ORDER — ATORVASTATIN CALCIUM 80 MG PO TABS: ORAL_TABLET | ORAL | 3 refills | Status: AC

## 2023-10-18 MED ORDER — ENALAPRIL MALEATE 2.5 MG PO TABS: 2.5000 mg | ORAL_TABLET | Freq: Every day | ORAL | 3 refills | Status: AC

## 2023-10-18 MED ORDER — EZETIMIBE 10 MG PO TABS: 10.0000 mg | ORAL_TABLET | Freq: Every day | ORAL | 3 refills | Status: AC

## 2023-10-18 MED ORDER — METOPROLOL TARTRATE 25 MG PO TABS: 25.0000 mg | ORAL_TABLET | Freq: Two times a day (BID) | ORAL | 3 refills | Status: AC

## 2023-10-18 NOTE — Telephone Encounter (Signed)
. *  STAT* If patient is at the pharmacy, call can be transferred to refill team.   1. Which medications need to be refilled? (please list name of each medication and dose if known)    atorvastatin  (LIPITOR) 80 MG tablet Take 1 tablet by mouth daily   clopidogrel  (PLAVIX ) 75 MG tablet TAKE 1 TABLET BY MOUTH DAILY   enalapril  (VASOTEC ) 2.5 MG tablet TAKE 1 TABLET BY MOUTH DAILY   ezetimibe  (ZETIA ) 10 MG tablet TAKE 1 TABLET BY MOUTH DAILY   metoprolol  tartrate (LOPRESSOR ) 25 MG tablet TAKE 1 TABLET BY MOUTH TWICE DAILY    4. Which pharmacy/location (including street and city if local pharmacy) is medication to be sent to?  [facility] - [place], [place] - [address]     5. Do they need a 30 day or 90 day supply? 90

## 2023-10-18 NOTE — Telephone Encounter (Signed)
 RX sent in

## 2023-12-16 DIAGNOSIS — M79644 Pain in right finger(s): Secondary | ICD-10-CM | POA: Diagnosis not present

## 2023-12-16 DIAGNOSIS — I714 Abdominal aortic aneurysm, without rupture, unspecified: Secondary | ICD-10-CM | POA: Diagnosis not present

## 2023-12-16 DIAGNOSIS — J439 Emphysema, unspecified: Secondary | ICD-10-CM | POA: Diagnosis not present

## 2023-12-16 DIAGNOSIS — K219 Gastro-esophageal reflux disease without esophagitis: Secondary | ICD-10-CM | POA: Diagnosis not present

## 2023-12-16 DIAGNOSIS — E7800 Pure hypercholesterolemia, unspecified: Secondary | ICD-10-CM | POA: Diagnosis not present

## 2023-12-16 DIAGNOSIS — N529 Male erectile dysfunction, unspecified: Secondary | ICD-10-CM | POA: Diagnosis not present

## 2023-12-16 DIAGNOSIS — E1169 Type 2 diabetes mellitus with other specified complication: Secondary | ICD-10-CM | POA: Diagnosis not present

## 2023-12-16 DIAGNOSIS — G4733 Obstructive sleep apnea (adult) (pediatric): Secondary | ICD-10-CM | POA: Diagnosis not present

## 2023-12-16 DIAGNOSIS — M25532 Pain in left wrist: Secondary | ICD-10-CM | POA: Diagnosis not present

## 2023-12-16 DIAGNOSIS — I25119 Atherosclerotic heart disease of native coronary artery with unspecified angina pectoris: Secondary | ICD-10-CM | POA: Diagnosis not present

## 2023-12-16 DIAGNOSIS — Z23 Encounter for immunization: Secondary | ICD-10-CM | POA: Diagnosis not present
